# Patient Record
Sex: Female | Born: 1970 | Race: White | Hispanic: No | Marital: Married | Smoking: Never smoker
Health system: Southern US, Community
[De-identification: ages and names within clinical notes are randomized; demographics above are authoritative.]

## PROBLEM LIST (undated history)

## (undated) DIAGNOSIS — K219 Gastro-esophageal reflux disease without esophagitis: Secondary | ICD-10-CM

## (undated) DIAGNOSIS — B029 Zoster without complications: Secondary | ICD-10-CM

## (undated) HISTORY — PX: COLONOSCOPY: SHX174

## (undated) HISTORY — DX: Zoster without complications: B02.9

## (undated) HISTORY — PX: WISDOM TOOTH EXTRACTION: SHX21

## (undated) HISTORY — DX: Gastro-esophageal reflux disease without esophagitis: K21.9

---

## 2001-08-30 ENCOUNTER — Other Ambulatory Visit: Admission: RE | Admit: 2001-08-30 | Discharge: 2001-08-30 | Payer: Self-pay | Admitting: Gynecology

## 2004-08-11 ENCOUNTER — Inpatient Hospital Stay (HOSPITAL_COMMUNITY): Admission: AD | Admit: 2004-08-11 | Discharge: 2004-08-11 | Payer: Self-pay | Admitting: Obstetrics & Gynecology

## 2004-11-04 ENCOUNTER — Ambulatory Visit (HOSPITAL_COMMUNITY): Admission: RE | Admit: 2004-11-04 | Discharge: 2004-11-04 | Payer: Self-pay | Admitting: Obstetrics and Gynecology

## 2004-11-07 ENCOUNTER — Other Ambulatory Visit: Admission: RE | Admit: 2004-11-07 | Discharge: 2004-11-07 | Payer: Self-pay | Admitting: Obstetrics and Gynecology

## 2005-01-26 HISTORY — PX: CHOLECYSTECTOMY: SHX55

## 2005-05-29 ENCOUNTER — Inpatient Hospital Stay (HOSPITAL_COMMUNITY): Admission: AD | Admit: 2005-05-29 | Discharge: 2005-05-31 | Payer: Self-pay | Admitting: Obstetrics and Gynecology

## 2005-07-14 ENCOUNTER — Ambulatory Visit (HOSPITAL_COMMUNITY): Admission: RE | Admit: 2005-07-14 | Discharge: 2005-07-14 | Payer: Self-pay | Admitting: General Surgery

## 2005-07-14 ENCOUNTER — Encounter (INDEPENDENT_AMBULATORY_CARE_PROVIDER_SITE_OTHER): Payer: Self-pay | Admitting: Specialist

## 2007-02-21 ENCOUNTER — Inpatient Hospital Stay (HOSPITAL_COMMUNITY): Admission: AD | Admit: 2007-02-21 | Discharge: 2007-02-23 | Payer: Self-pay | Admitting: Obstetrics and Gynecology

## 2007-03-27 IMAGING — RF DG CHOLANGIOGRAM OPERATIVE
1 series · 5 of 5 positions shown · non-contrast
Comparison: none

CLINICAL DATA: Laparoscopic cholecystectomy.
 INTRAOPERATIVE CHOLANGIOGRAM
 45 C-arm fluoroscopic-guided spot images.  No biliary ductal filling defects, stricture or dilatation.  Contrast passes into the duodenum.

[Series 1: run · 2 acquisitions, 5 frames shown]
[im 1/2]
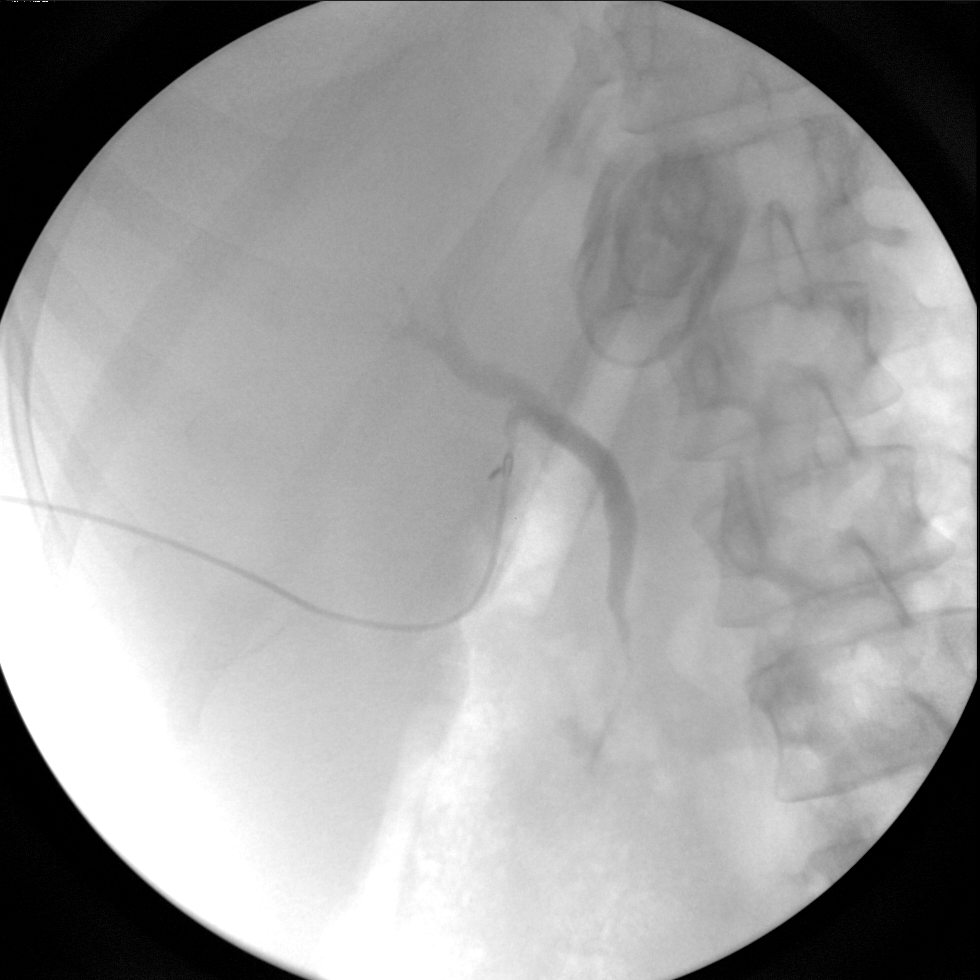
[im 1/2]
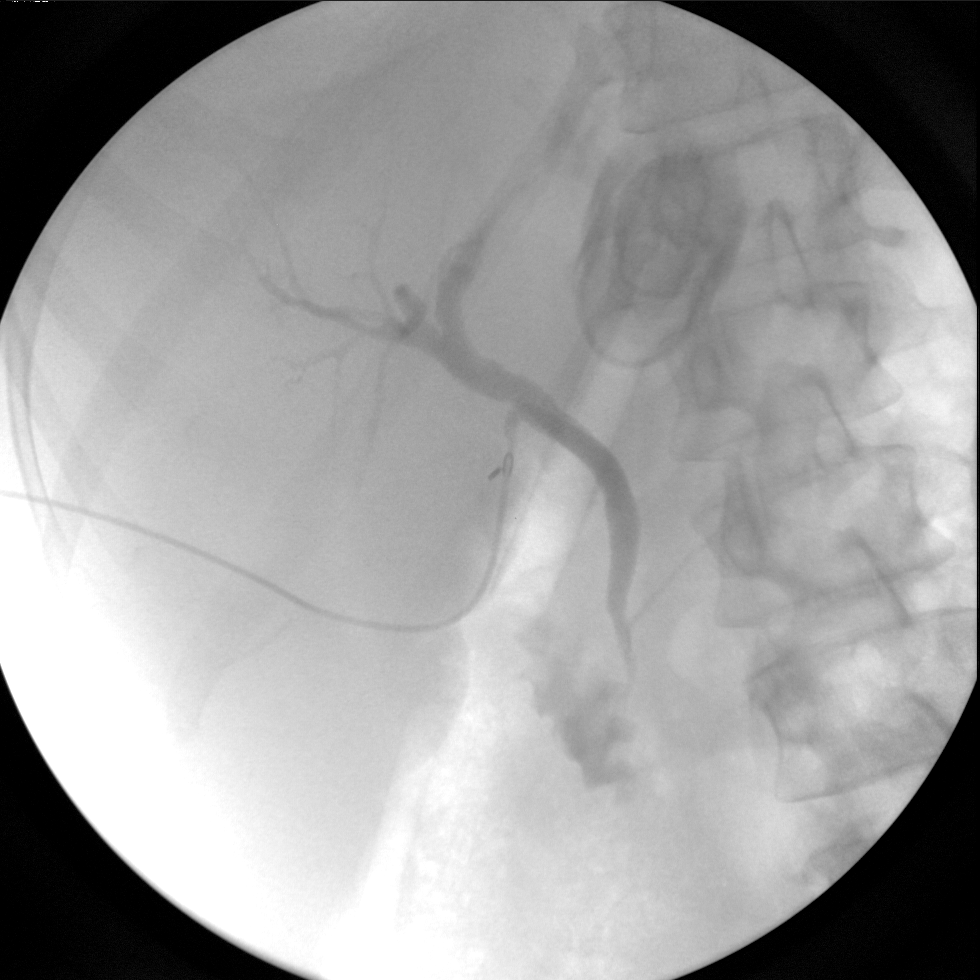
[im 1/2]
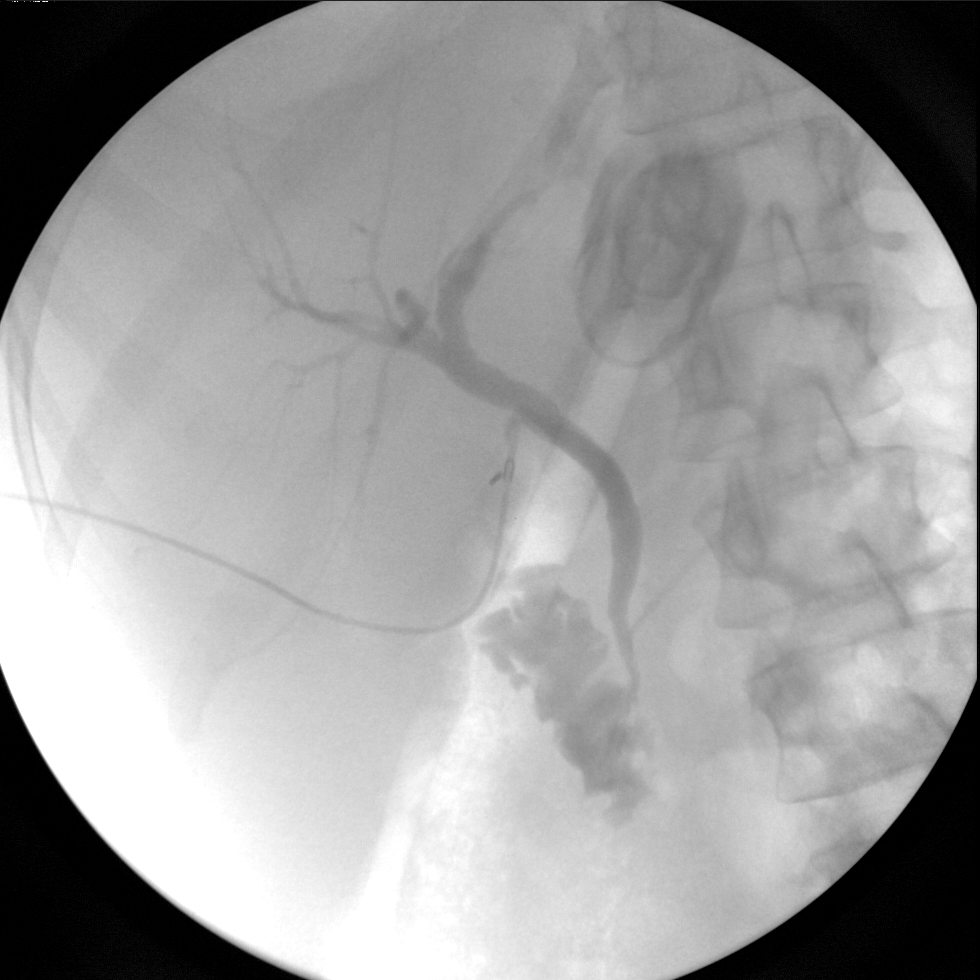
[im 1/2]
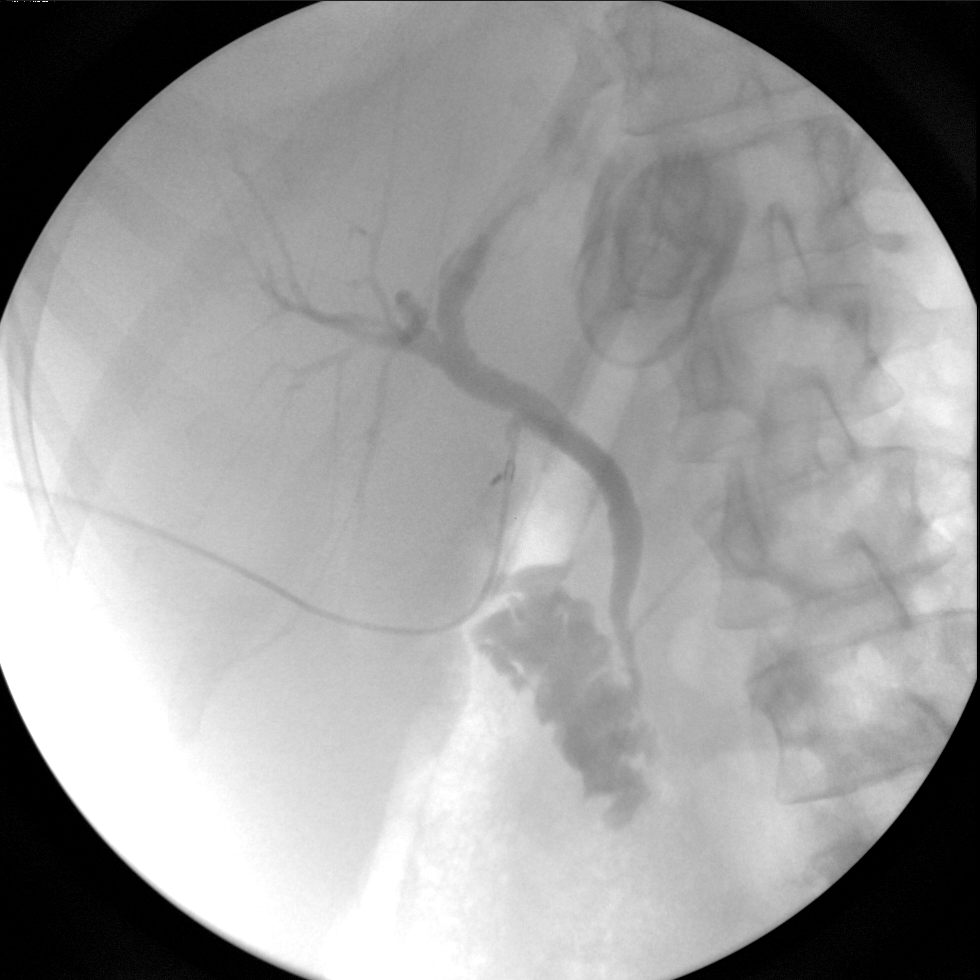
[im 2/2]
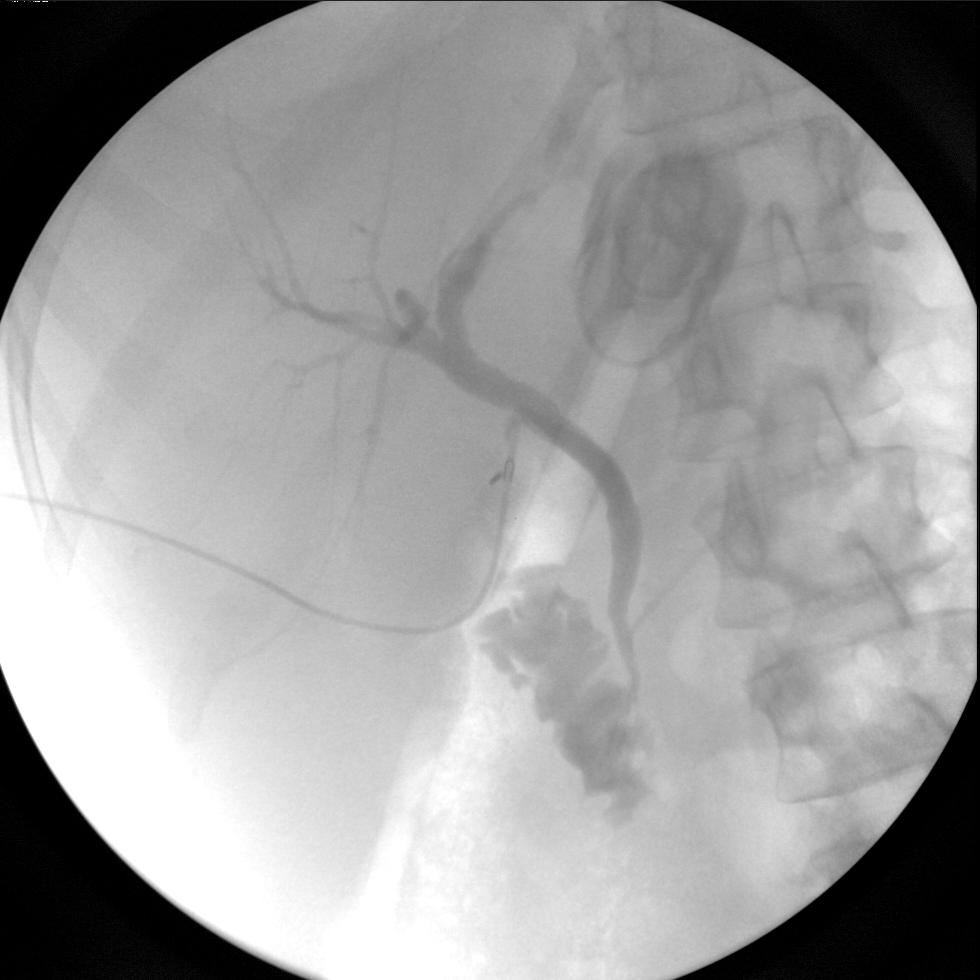

[5 of 5 positions shown; findings below may reference images not displayed]

IMPRESSION: Negative operative cholangiogram.

## 2010-06-10 NOTE — H&P (Signed)
NAMEMELAYNA, ROBARTS               ACCOUNT NO.:  [id number]   MEDICAL RECORD NO.:  [id number]          PATIENT TYPE:  MAT   LOCATION:  MATC                          FACILITY:  [facility]   PHYSICIAN:  Crist Fat. Rivard, M.D. DATE OF BIRTH:  17-Dec-1970   DATE OF ADMISSION:  02/21/2007  DATE OF DISCHARGE:                              HISTORY & PHYSICAL   HISTORY OF PRESENT ILLNESS:  Ms. Rudy is a 40 year old gravida II,  para 1-0-0-1 at 39-3/7 weeks who presents today with uterine  contractions every 5 minutes for the last several hours.  Her cervix has  been 3 cm in the office.  She denies any leaking or bleeding and reports  positive fetal movement.  Pregnancy has been remarkable for:  1. __________ pregnancies.  2. Group B strep negative.  3. Mild thrombocytopenia during pregnancy with platelet count on last      exam at approximately 108.  New OB was 138.  4. Platelet count was 138 at her OB visit.  It was 108 on her most      recent examination.   PRENATAL LABORATORIES:  Blood type is O positive, Rh antibody negative,  VDRL nonreactive, rubella titer positive, hepatitis B surface antigen is  negative, HIV is nonreactive.  Cystic fibrosis testing is negative.  Hemoglobin upon entering the practice was 12.8.  First trimester screen  was declined.  Quadruple screen was normal.  Glucola was normal.  Group  B strep culture was negative.   HISTORY OF PRESENT PREGNANCY:  The patient entered care approximately 12  weeks.  EDC of February 26, 2007, was established by last menstrual  period.  She had an ultrasound at 19 weeks for normal growth.  She had a  quadruple screen that was normal.  She had some reflux at 27 weeks and  was placed on Zofran and Protonix.  She was placed on Flexeril for some  low back pain spasms that helped.  She had some right upper quadrant  skin burning like when she has gallbladder pain, but no nausea, vomiting  or any other issues.  Her Group B strep was negative.   This was  determined to be likely musculoskeletal. CMP and CBC were done that were  normal.  She was placed on Vicodin if necessary.  The rest of her  pregnancy was essentially uncomplicated.   OBSTETRICAL HISTORY:  In 2007, she had a vaginal birth of a female  infant.  Weight is not documented on her prenatal record.  She was  overdue.  She had meconium stained fluid.  She did have retained  placenta and a third degree laceration.  She did not use any pain  medication until after delivery.   PAST MEDICAL HISTORY:  She had a history of UTIs in the past.   PAST SURGICAL HISTORY:  Only other surgery was wisdom teeth removed in  the past and gallbladder surgery in July 2008.   ALLERGIES:  SULFA.   FAMILY HISTORY:  Her father had an MI and angioplasty.  Her maternal  aunt and father had hypertension.  Her mother has  varicose veins.  Her  father had colon cancer in July 2008.  Maternal grandmother had  emphysema.  Maternal grandmother had diabetes.  Maternal grandfather had  lung cancer.   GENETIC HISTORY:  Unremarkable.   SOCIAL HISTORY:  The patient is married to father of the baby.  He is  involved and supported.  His name is [employer].  The patient is  Caucasian.  She is of the Muslim faith.  She is graduated, educated.  She is a Secretary/administrator.  Her husband also is college educated.  He  is self-employed.  She has been followed by the Certified Nurse Midwife  Service at [facility].  She denies any alcohol, drug or tobacco  use during this pregnancy.   PHYSICAL EXAMINATION:  VITAL SIGNS:  Stable.  The patient is afebrile.  HEENT:  Within normal limits.  LUNGS:  Breath sounds are clear.  HEART:  Regular rate and rhythm without murmur.  BREASTS:  Soft and nontender.  ABDOMEN:  Fundal height is approximately 39 cm.  Estimated fetal weight  is 7-8 pounds.  Uterine contractions are every 3 to 3-1/2 minutes,  moderate quality.  Cervix initially was 3 cm, it is now  4 cm, 100%  vertex and a -1 station with bulging bag of water.  Fetal heart rate is  reactive with decelerations.  There is a small amount of bloody show  noted.  EXTREMITIES:  Deep tendon reflexes are 2+ without clonus.  There is a  trace edema noted.   IMPRESSION:  1. Intrauterine pregnancy at 39-3/7 weeks.  2. Early labor.  3. Negative group B strep.  4. Desires noninterference of labor as much as possible.   PLAN:  1. Admit to birthing suite for consult with Dr. Silverio Lay as      attending physician.  2. Routine certified nurse midwife orders.  3. Will defer IV at this time.  If platelet count is low will initiate      saline lock.  4. The patient does agree to artificial rupture of membranes, and we      will do this once she gets to Labor and Delivery.      Renaldo Reel Emilee Hero, C.N.M.      Crist Fat Rivard, M.D.  Electronically Signed    VLL/MEDQ  D:  02/21/2007  T:  02/21/2007  Job:  [id number]

## 2010-06-13 NOTE — Op Note (Signed)
NAMESEERAT, [name]               ACCOUNT NO.:  [id number]   MEDICAL RECORD NO.:  [id number]          PATIENT TYPE:  AMB   LOCATION:  SDS                          FACILITY:  [facility]   PHYSICIAN:  Leonie Man, M.D.   DATE OF BIRTH:  1970/03/30   DATE OF PROCEDURE:  07/14/2005  DATE OF DISCHARGE:  07/14/2005                                 OPERATIVE REPORT   PREOPERATIVE DIAGNOSIS:  Chronic calculous cholecystitis.   POSTOPERATIVE DIAGNOSIS:  Chronic calculous cholecystitis.   PROCEDURE:  Laparoscopic cholecystectomy with interoperative cholangiogram.   SURGEON:  Leonie Man, M.D.   ASSISTANT:  Gabrielle Dare. Janee Morn, M.D.   ANESTHESIA:  General.   SPECIMENS:  Gallbladder with stones.   ESTIMATED BLOOD LOSS:  Minimal.   COMPLICATIONS:  None.   DISPOSITION:  The patient returned to the PACU in excellent condition.   [name] is a 40 year old female who is recently postpartum. She  presents with the onset of right upper quadrant abdominal pain which has  been worsening since her delivery.  This is associated with some mild  episodes of nausea but without any vomiting.  She underwent abdominal  ultrasound which demonstrates cholelithiasis without evidence of ductal  dilatation or pericholecystic fluid.  Liver function studies done prior to  surgery showed mild SGOT and SGPT elevations, no hyperbilirubinemia.  The  patient comes to the operating room after the risks and potential benefits  of surgery have been fully discussed.  All questions were answered and  consent was obtained.   PROCEDURE:  The patient is positioned supinely. Following the induction of  general endotracheal anesthesia, the abdomen is prepped and draped to be  included in the sterile operative field.  Open laparoscopy was created at  the umbilicus with insertion of a Hassan cannula and insufflation of the  peritoneal cavity to 14 mmHg pressure.  The camera was inserted and visual  exploration carried  out. The gallbladder was noted to be chronically  scarred.  The liver edges were sharp.  The liver surfaces smooth.  The  anterior gastric wall and duodenum with some adhesions up to the  gallbladder, but otherwise, appeared normal.  Neither the small or large  intestine appeared to be abnormal, the pelvic organs were not visualized.   Under direct vision, epigastric and lateral ports were placed.  The  gallbladder is grasped and retracted cephalad and dissection carried down  toward the ampulla with lysis of the adhesions from the duodenum to the  gallbladder.  Dissection was carried in the region of the hepatoduodenal  ligament with isolation of the cystic artery and cystic duct.  The cystic  duct was traced up to its entry into the gallbladder wall and down to the  common bile duct.  The cystic duct was clipped proximally and a cystic duct  cholangiogram was carried out by passing a Cook catheter into the abdomen  and placing it into the cystic duct and injecting 1/2 strength Hypaque dye  under fluoroscopic guidance into the extrahepatic biliary system.  The  resulting cholangiogram showed prompt flow of contrast into the duodenum and  up into the upper right and left hepatic radicals without any evidence of  filling defects.  The cholangiocatheter was removed and the cystic duct was  then doubly clipped and transected. The cystic artery was then isolated and  doubly clipped and transected.  The gallbladder was then dissected free from  the liver bed using electrocautery and maintaining hemostasis throughout the  course of dissection.  At the end of the dissection, the liver bed was then  checked for hemostasis and noted to be dry.  Sponge, instruments, and sharp  counts were fully verified.  The gallbladder was retrieved from the abdomen  through the umbilical port.  The wounds then closed in layers as follows.  The umbilical wound in two layers with 0 Vicryl and 4-0 Monocryl,  epigastric  and flank wounds were closed with 4-0 Monocryl sutures.  All wounds were  reinforced with Steri-Strips and sterile dressings applied.  The anesthetic  was reversed and the patient removed from the operating room to the recovery  room in stable condition.  She tolerated the procedure well.      Leonie Man, M.D.  Electronically Signed     PB/MEDQ  D:  07/14/2005  T:  07/14/2005  Job:  [id number]   cc:   Dois Davenport A. Rivard, M.D.  Fax: [phone]

## 2010-06-13 NOTE — H&P (Signed)
[name], [name]               ACCOUNT NO.:  [id number]   MEDICAL RECORD NO.:  [id number]          PATIENT TYPE:  INP   LOCATION:  9162                          [facility]ACILITY:  [facility]   PHYSICIAN:  Hal Morales, M.D.DATE OF BIRTH:  [birth year]   DATE OF ADMISSION:  05/29/2005  DATE OF DISCHARGE:                                HISTORY & PHYSICAL   HISTORY OF PRESENT ILLNESS:  This is a 40 year old gravida 1, para 0 at 58-  5/7 weeks, who presents with complaints of gush of fluid this morning at 4  a.m.  She was originally scheduled for induction of labor this morning for  postdates.  Pregnancy has been followed by the midwife service and  remarkable for:  #1 - Unsure LMP, #2 - group B strep negative.   ALLERGIES:  SULFA.   OBSTETRICAL HISTORY:  The patient is a primigravida.   PAST MEDICAL HISTORY:  Medical history is remarkable for childhood varicella  and history of UTIs.   PAST SURGICAL HISTORY:  Surgical history is remarkable for wisdom teeth  removed in her early 26s.   FAMILY HISTORY:  Family history is remarkable for father with MI, father and  mother with hypertension, mother with varicose veins, grandmother with  emphysema, grandmother with diabetes, grandfather with lung cancer.   GENETIC HISTORY:  Negative.   SOCIAL HISTORY:  The patient is married to [facility], who is involved  and supportive.  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  She denies any alcohol, tobacco or drug use.   PRENATAL LABORATORY DATA:  Hemoglobin 13.3, platelets 167,000.  Blood type O  positive, antibody screen negative.  RPR nonreactive.  Rubella immune.  Hepatitis negative.  Pap test normal.  Gonorrhea negative.  Chlamydia  negative.  Cystic fibrosis negative.   HISTORY OF CURRENT PREGNANCY:  The patient entered care at 74 weeks'  gestation.  She had an ultrasound to confirm dates and at that time, quad  screen was done and was normal.  Ultrasound at 19 weeks was  normal and she  had a group B strep that was negative at term.   OBJECTIVE DATA:  VITAL SIGNS:  Stable and afebrile.  HEENT:  Within normal limits.  NECK:  Thyroid normal and not enlarged.  CHEST:  Clear to auscultation.  HEART:  Regular rate and rhythm.  ABDOMEN:  Gravid, 40 cm, vertex to Leopold's.  Fetal monitor shows reactive  fetal heart rate with uterine contractions every 1-1/2 to 2 minutes.  PELVIC:  Cervix is 4, 90, -2, vertex presentation.  EXTREMITIES:  Within normal limits.   ASSESSMENT:  1.  Intrauterine pregnancy at 41-5/7 weeks.  2.  Spontaneous rupture of membranes.  3.  Early active labor.   PLAN:  1.  Admit to birthing suites.  2.  Dr. Pennie Rushing notified.  3.  Patient desires non-interventive birth and further orders to follow.      Marie L. Williams, C.N.M.      Hal Morales, M.D.  Electronically Signed    MLW/MEDQ  D:  05/29/2005  T:  05/29/2005  Job:  [id number]

## 2010-10-16 LAB — CBC
HCT: 33.3 — ABNORMAL LOW
HCT: 35.5 — ABNORMAL LOW
Hemoglobin: 11.4 — ABNORMAL LOW
Hemoglobin: 12.3
MCHC: 34.3
MCHC: 34.5
MCV: 85.6
MCV: 86.5
Platelets: 103 — ABNORMAL LOW
Platelets: 114 — ABNORMAL LOW
RBC: 3.85 — ABNORMAL LOW
RBC: 4.15
RDW: 15.6 — ABNORMAL HIGH
RDW: 16 — ABNORMAL HIGH
WBC: 10.9 — ABNORMAL HIGH
WBC: 9.9

## 2010-10-16 LAB — PLATELET COUNT: Platelets: 100 — ABNORMAL LOW

## 2010-10-16 LAB — RPR: RPR Ser Ql: NONREACTIVE

## 2013-02-08 ENCOUNTER — Emergency Department (HOSPITAL_COMMUNITY)
Admission: EM | Admit: 2013-02-08 | Discharge: 2013-02-08 | Disposition: A | Discharge status: Home / Self Care | Payer: BC Managed Care – PPO | Source: Home / Self Care | Attending: Family Medicine | Admitting: Family Medicine

## 2013-02-08 ENCOUNTER — Encounter (HOSPITAL_COMMUNITY): Payer: Self-pay | Admitting: Emergency Medicine

## 2013-02-08 DIAGNOSIS — M545 Low back pain, unspecified: Secondary | ICD-10-CM

## 2013-02-08 MED ORDER — DICLOFENAC SODIUM 50 MG PO TBEC: 50.0000 mg | DELAYED_RELEASE_TABLET | Freq: Two times a day (BID) | ORAL | Status: DC | PRN

## 2013-02-08 MED ORDER — CYCLOBENZAPRINE HCL 5 MG PO TABS: 5.0000 mg | ORAL_TABLET | Freq: Every evening | ORAL | Status: DC | PRN

## 2013-02-08 MED ORDER — TRAMADOL HCL 50 MG PO TABS: 50.0000 mg | ORAL_TABLET | Freq: Four times a day (QID) | ORAL | Status: DC | PRN

## 2013-02-08 NOTE — ED Notes (Signed)
Pain i nback after putting on shoes to get ready for work; no other known cause of pain; pain worse w certain movements

## 2013-02-08 NOTE — ED Provider Notes (Signed)
[name] is a 43 y.o. female who presents to Urgent Care today for low back pain. Patient notes acute onset of right sided low back pain radiating to the left low back occurring this morning. Pain is moderate and worse with activity. She denies any pain radiating to her legs weakness numbness bowel bladder dysfunction or difficulty walking. She's tried taking ibuprofen which did not help much. She feels well otherwise. No injury.   History reviewed. No pertinent past medical history. History  Substance Use Topics  . Smoking status: Never Smoker   . Smokeless tobacco: Not on file  . Alcohol Use: Not on file   ROS as above Medications: No current facility-administered medications for this encounter.   Current Outpatient Prescriptions  Medication Sig Dispense Refill  . cyclobenzaprine (FLEXERIL) 5 MG tablet Take 1 tablet (5 mg total) by mouth at bedtime as needed for muscle spasms.  20 tablet  0  . diclofenac (VOLTAREN) 50 MG EC tablet Take 1 tablet (50 mg total) by mouth 2 (two) times daily as needed.  60 tablet  0  . traMADol (ULTRAM) 50 MG tablet Take 1 tablet (50 mg total) by mouth every 6 (six) hours as needed.  10 tablet  0    Exam:  BP 134/86  Pulse 85  Temp(Src) 98.1 F (36.7 C) (Oral)  Resp 16  SpO2 100%  LMP 01/31/2013 Gen: Well NAD BACK: NONTENDER TO SPINAL MIDLINE. TENDER PALPATION BILATERAL LUMBAR PARASPINAL MUSCLES.  Range of motion is intact. Straight leg raise and Corky Sox tests are negative bilaterally. Sensation is intact bilateral lower extremities. Strength is intact bilateral lower extremities. Patient can stand on her toes heels get on and off exam table squat and has a normal gait. Reflexes are equal and normal bilateral knees and ankles.   Assessment and Plan: 43 y.o. female with lumbago due to myofascial injury. Plan treatment with diclofenac, Flexeril, and tramadol. Additionally will use heating pad at home exercise program. Followup with Dr. Tamala Julian or  [facility] sports medicine if not improving.  Discussed warning signs or symptoms. Please see discharge instructions. Patient expresses understanding.    Gregor Hams, MD 02/08/13 [phone]

## 2013-02-08 NOTE — Discharge Instructions (Signed)
Thank you for coming in today. Take diclofenac twice daily for pain as needed starting this evening. Use Flexeril at bedtime as needed. Use tramadol for severe pain. Do not drive after taking either Flexeril or tramadol. Followup with Dr. Anola Gurney sports medicine if not getting better in 1-2 weeks. Come back or go to the emergency room if you notice new weakness new numbness problems walking or bowel or bladder problems.  Back Exercises These exercises may help you when beginning to rehabilitate your injury. Your symptoms may resolve with or without further involvement from your physician, physical therapist or athletic trainer. While completing these exercises, remember:   Restoring tissue flexibility helps normal motion to return to the joints. This allows healthier, less painful movement and activity.  An effective stretch should be held for at least 30 seconds.  A stretch should never be painful. You should only feel a gentle lengthening or release in the stretched tissue. STRETCH  Extension, Prone on Elbows   Lie on your stomach on the floor, a bed will be too soft. Place your palms about shoulder width apart and at the height of your head.  Place your elbows under your shoulders. If this is too painful, stack pillows under your chest.  Allow your body to relax so that your hips drop lower and make contact more completely with the floor.  Hold this position for __________ seconds.  Slowly return to lying flat on the floor. Repeat __________ times. Complete this exercise __________ times per day.  RANGE OF MOTION  Extension, Prone Press Ups   Lie on your stomach on the floor, a bed will be too soft. Place your palms about shoulder width apart and at the height of your head.  Keeping your back as relaxed as possible, slowly straighten your elbows while keeping your hips on the floor. You may adjust the placement of your hands to maximize your comfort. As you gain motion, your  hands will come more underneath your shoulders.  Hold this position __________ seconds.  Slowly return to lying flat on the floor. Repeat __________ times. Complete this exercise __________ times per day.  RANGE OF MOTION- Quadruped, Neutral Spine   Assume a hands and knees position on a firm surface. Keep your hands under your shoulders and your knees under your hips. You may place padding under your knees for comfort.  Drop your head and point your tail bone toward the ground below you. This will round out your low back like an angry cat. Hold this position for __________ seconds.  Slowly lift your head and release your tail bone so that your back sags into a large arch, like an old horse.  Hold this position for __________ seconds.  Repeat this until you feel limber in your low back.  Now, find your "sweet spot." This will be the most comfortable position somewhere between the two previous positions. This is your neutral spine. Once you have found this position, tense your stomach muscles to support your low back.  Hold this position for __________ seconds. Repeat __________ times. Complete this exercise __________ times per day.  STRETCH  Flexion, Single Knee to Chest   Lie on a firm bed or floor with both legs extended in front of you.  Keeping one leg in contact with the floor, bring your opposite knee to your chest. Hold your leg in place by either grabbing behind your thigh or at your knee.  Pull until you feel a gentle stretch in  your low back. Hold __________ seconds.  Slowly release your grasp and repeat the exercise with the opposite side. Repeat __________ times. Complete this exercise __________ times per day.  STRETCH - Hamstrings, Standing  Stand or sit and extend your right / left leg, placing your foot on a chair or foot stool  Keeping a slight arch in your low back and your hips straight forward.  Lead with your chest and lean forward at the waist until you  feel a gentle stretch in the back of your right / left knee or thigh. (When done correctly, this exercise requires leaning only a small distance.)  Hold this position for __________ seconds. Repeat __________ times. Complete this stretch __________ times per day. STRENGTHENING  Deep Abdominals, Pelvic Tilt   Lie on a firm bed or floor. Keeping your legs in front of you, bend your knees so they are both pointed toward the ceiling and your feet are flat on the floor.  Tense your lower abdominal muscles to press your low back into the floor. This motion will rotate your pelvis so that your tail bone is scooping upwards rather than pointing at your feet or into the floor.  With a gentle tension and even breathing, hold this position for __________ seconds. Repeat __________ times. Complete this exercise __________ times per day.  STRENGTHENING  Abdominals, Crunches   Lie on a firm bed or floor. Keeping your legs in front of you, bend your knees so they are both pointed toward the ceiling and your feet are flat on the floor. Cross your arms over your chest.  Slightly tip your chin down without bending your neck.  Tense your abdominals and slowly lift your trunk high enough to just clear your shoulder blades. Lifting higher can put excessive stress on the low back and does not further strengthen your abdominal muscles.  Control your return to the starting position. Repeat __________ times. Complete this exercise __________ times per day.  STRENGTHENING  Quadruped, Opposite UE/LE Lift   Assume a hands and knees position on a firm surface. Keep your hands under your shoulders and your knees under your hips. You may place padding under your knees for comfort.  Find your neutral spine and gently tense your abdominal muscles so that you can maintain this position. Your shoulders and hips should form a rectangle that is parallel with the floor and is not twisted.  Keeping your trunk steady, lift your  right hand no higher than your shoulder and then your left leg no higher than your hip. Make sure you are not holding your breath. Hold this position __________ seconds.  Continuing to keep your abdominal muscles tense and your back steady, slowly return to your starting position. Repeat with the opposite arm and leg. Repeat __________ times. Complete this exercise __________ times per day. Document Released: 01/30/2005 Document Revised: 04/06/2011 Document Reviewed: 04/26/2008 [facility] Patient Information 2014 [place], [place].

## 2014-09-27 ENCOUNTER — Ambulatory Visit (INDEPENDENT_AMBULATORY_CARE_PROVIDER_SITE_OTHER): Payer: 59 | Admitting: Primary Care

## 2014-09-27 ENCOUNTER — Encounter: Payer: Self-pay | Admitting: Primary Care

## 2014-09-27 VITALS — BP 132/90 | HR 93 | Temp 98.3°F | Ht 61.0 in | Wt 154.8 lb

## 2014-09-27 DIAGNOSIS — R030 Elevated blood-pressure reading, without diagnosis of hypertension: Secondary | ICD-10-CM | POA: Diagnosis not present

## 2014-09-27 HISTORY — DX: Elevated blood-pressure reading, without diagnosis of hypertension: R03.0

## 2014-09-27 NOTE — Assessment & Plan Note (Signed)
Elevated reading today in the clinic today at 132/90, same on recheck. Will monitor at next visit.

## 2014-09-27 NOTE — Progress Notes (Signed)
Pre visit review using our clinic review tool, if applicable. No additional management support is needed unless otherwise documented below in the visit note. 

## 2014-09-27 NOTE — Progress Notes (Signed)
Subjective:    Patient ID: [name], female    DOB: January 10, 1971, 44 y.o.   MRN: [id number]  HPI  [name] is a 43 year old female who presents today to establish care and discuss the problems mentioned below. Will obtain old records.  1) Elevated blood pressure reading: Elevated in the clinic today at 132/90. Denies ever having elevated blood pressure. Denies headaches, chest pain.  2) Dizziness: Intermittently once or twice monthly that will last for a few minutes. Sometimes will experience symptoms of the room spinning and feeling off balance. She does not currently take any medication for her dizziness.  3) Diarrhea: Present most days of the week with 2-3 episodes a day. Gall bladder was removed in 2007 after her first child. She will take imodium rarely which will help decrease symptoms.   Review of Systems  Constitutional: Negative for unexpected weight change.  HENT: Negative for rhinorrhea.   Respiratory: Negative for cough and shortness of breath.   Cardiovascular: Negative for chest pain.  Gastrointestinal: Positive for diarrhea. Negative for constipation.  Genitourinary: Negative for difficulty urinating.       Regular periods  Musculoskeletal: Negative for myalgias and arthralgias.  Skin: Negative for rash.  Neurological: Positive for dizziness. Negative for numbness and headaches.  Psychiatric/Behavioral:       Occasionally feels down and anxious, phq 9 score of 6 today.       No past medical history on file.  Social History   Social History  . Marital Status: Married    Spouse Name: N/A  . Number of Children: N/A  . Years of Education: N/A   Occupational History  . Not on file.   Social History Main Topics  . Smoking status: Never Smoker   . Smokeless tobacco: Not on file  . Alcohol Use: Not on file  . Drug Use: Not on file  . Sexual Activity: Not on file   Other Topics Concern  . Not on file   Social History Narrative   Married.   2  children.   Works as a Building control surveyor.   Masters Degree   Enjoys spending time outside, once painted.       Past Surgical History  Procedure Laterality Date  . Cholecystectomy  2007    Family History  Problem Relation Age of Onset  . Hypertension Father   . Heart disease Father     Myocardial infarction  . Hypercholesterolemia Father   . Anxiety disorder Sister     Allergies  Allergen Reactions  . Sulfa Antibiotics     No current outpatient prescriptions on file prior to visit.   No current facility-administered medications on file prior to visit.    BP 132/90 mmHg  Pulse 93  Temp(Src) 98.3 F (36.8 C) (Oral)  Ht 5\' 1"  (1.549 m)  Wt 154 lb 12.8 oz (70.217 kg)  BMI 29.26 kg/m2  SpO2 97%  LMP 09/06/2014    Objective:   Physical Exam  Constitutional: She appears well-nourished.  Cardiovascular: Normal rate and regular rhythm.   Pulmonary/Chest: Effort normal and breath sounds normal.  Abdominal: Soft. Bowel sounds are normal. There is no tenderness.  Skin: Skin is warm and dry.  Psychiatric: She has a normal mood and affect.  Became tearful duing exam when asked about depression and anxiety          Assessment & Plan:  She became tearful during exam when asked about concerns for anxiety and depression. She  is under a lot of stress at home with her children and doesn't get much emotional support from her husband. She declines therapy today but will think about it for next visit. No bruising or signs of abuse noted, but she was wearing long sleeves and pants. Will continue to monitor.

## 2014-09-27 NOTE — Patient Instructions (Signed)
Please schedule a physical with me in the next 3 months. You will also schedule a lab only appointment one week prior. We will discuss your lab results during your physical.  It was a pleasure to meet you today! Please don't hesitate to call me with any questions. Welcome to [facility]!   

## 2014-11-15 ENCOUNTER — Other Ambulatory Visit: Payer: Self-pay | Admitting: Primary Care

## 2014-11-15 DIAGNOSIS — Z0000 Encounter for general adult medical examination without abnormal findings: Secondary | ICD-10-CM

## 2014-11-23 ENCOUNTER — Other Ambulatory Visit (INDEPENDENT_AMBULATORY_CARE_PROVIDER_SITE_OTHER): Payer: 59

## 2014-11-23 DIAGNOSIS — Z0000 Encounter for general adult medical examination without abnormal findings: Secondary | ICD-10-CM | POA: Diagnosis not present

## 2014-11-23 LAB — VITAMIN D 25 HYDROXY (VIT D DEFICIENCY, FRACTURES): VITD: 9.35 ng/mL — ABNORMAL LOW (ref 30.00–100.00)

## 2014-11-23 LAB — COMPREHENSIVE METABOLIC PANEL
ALT: 18 U/L (ref 0–35)
AST: 18 U/L (ref 0–37)
Albumin: 4.1 g/dL (ref 3.5–5.2)
Alkaline Phosphatase: 82 U/L (ref 39–117)
BUN: 9 mg/dL (ref 6–23)
CO2: 27 mEq/L (ref 19–32)
Calcium: 9.4 mg/dL (ref 8.4–10.5)
Chloride: 104 mEq/L (ref 96–112)
Creatinine, Ser: 0.73 mg/dL (ref 0.40–1.20)
GFR: 92.65 mL/min (ref >60.00)
Glucose, Bld: 91 mg/dL (ref 70–99)
Potassium: 4.2 mEq/L (ref 3.5–5.1)
Sodium: 140 mEq/L (ref 135–145)
Total Bilirubin: 0.3 mg/dL (ref 0.2–1.2)
Total Protein: 7 g/dL (ref 6.0–8.3)

## 2014-11-23 LAB — LIPID PANEL
Cholesterol: 190 mg/dL (ref 0–200)
HDL: 39.3 mg/dL (ref >39.00)
LDL Cholesterol: 125 mg/dL — ABNORMAL HIGH (ref 0–99)
NonHDL: 151.09
Total CHOL/HDL Ratio: 5
Triglycerides: 129 mg/dL (ref 0.0–149.0)
VLDL: 25.8 mg/dL (ref 0.0–40.0)

## 2014-11-23 LAB — CBC
HCT: 42 % (ref 36.0–46.0)
Hemoglobin: 13.7 g/dL (ref 12.0–15.0)
MCHC: 32.6 g/dL (ref 30.0–36.0)
MCV: 82.8 fl (ref 78.0–100.0)
Platelets: 194 10*3/uL (ref 150.0–400.0)
RBC: 5.07 Mil/uL (ref 3.87–5.11)
RDW: 14.6 % (ref 11.5–15.5)
WBC: 8.7 10*3/uL (ref 4.0–10.5)

## 2014-11-23 LAB — TSH: TSH: 1.29 u[IU]/mL (ref 0.35–4.50)

## 2014-11-30 ENCOUNTER — Encounter: Payer: Self-pay | Admitting: Primary Care

## 2014-11-30 ENCOUNTER — Encounter: Payer: Self-pay | Admitting: Gastroenterology

## 2014-11-30 ENCOUNTER — Ambulatory Visit (INDEPENDENT_AMBULATORY_CARE_PROVIDER_SITE_OTHER): Payer: 59 | Admitting: Primary Care

## 2014-11-30 VITALS — BP 118/78 | HR 94 | Temp 98.0°F | Ht 61.0 in | Wt 157.0 lb

## 2014-11-30 DIAGNOSIS — R030 Elevated blood-pressure reading, without diagnosis of hypertension: Secondary | ICD-10-CM

## 2014-11-30 DIAGNOSIS — Z23 Encounter for immunization: Secondary | ICD-10-CM | POA: Diagnosis not present

## 2014-11-30 DIAGNOSIS — E559 Vitamin D deficiency, unspecified: Secondary | ICD-10-CM | POA: Diagnosis not present

## 2014-11-30 DIAGNOSIS — Z0000 Encounter for general adult medical examination without abnormal findings: Secondary | ICD-10-CM

## 2014-11-30 DIAGNOSIS — Z800 Family history of malignant neoplasm of digestive organs: Secondary | ICD-10-CM

## 2014-11-30 MED ORDER — VITAMIN D (ERGOCALCIFEROL) 1.25 MG (50000 UNIT) PO CAPS: ORAL_CAPSULE | ORAL | Status: DC

## 2014-11-30 NOTE — Assessment & Plan Note (Signed)
Vitamin D level of 9. RX for vitamin D 50,000 units weekly provided. Will recheck in 3 months.

## 2014-11-30 NOTE — Progress Notes (Signed)
Subjective:    Patient ID: [name], female    DOB: 09-19-1970, 44 y.o.   MRN: [id number]  HPI  [name] is a 44 year old female who presents today for complete physical.  Immunizations: -Tetanus: Unsure, believes it's been over 10 years.  -Influenza: Due today.   Diet: Endorses a poor diet. Breakfast: Skips Snack: Cookies Lunch: Skips Snack: Cereal  Dinner: Varies. Cooks (hamburger helper, oven fried chicken, starch, beans, bread). Fast food once or twice a weekend. Desserts: Occasionally.  Exercise: Does not exercise. Eye exam: Completed 1 year ago, no changes in vision. Dental exam: Completed 8 years ago. Pap Smear: Completed 7 years ago. Declines today. Mammogram: Has never completed. Handout provided.   Review of Systems  Constitutional: Positive for fatigue. Negative for fever, chills and unexpected weight change.  HENT: Positive for rhinorrhea.   Respiratory: Positive for cough. Negative for shortness of breath.   Cardiovascular: Negative for chest pain.  Gastrointestinal: Negative for diarrhea and constipation.  Genitourinary: Negative for difficulty urinating.       Regular periods  Musculoskeletal: Negative for myalgias and arthralgias.  Skin: Negative for rash.  Neurological: Negative for dizziness, numbness and headaches.  Psychiatric/Behavioral:       Denies concerns for anxiety or depression.       No past medical history on file.  Social History   Social History  . Marital Status: Married    Spouse Name: N/A  . Number of Children: N/A  . Years of Education: N/A   Occupational History  . Not on file.   Social History Main Topics  . Smoking status: Never Smoker   . Smokeless tobacco: Not on file  . Alcohol Use: Not on file  . Drug Use: Not on file  . Sexual Activity: Not on file   Other Topics Concern  . Not on file   Social History Narrative   Married.   2 children.   Works as a Building control surveyor.   Masters Degree   Enjoys spending time outside, once painted.       Past Surgical History  Procedure Laterality Date  . Cholecystectomy  2007    Family History  Problem Relation Age of Onset  . Hypertension Father   . Heart disease Father     Myocardial infarction  . Hypercholesterolemia Father   . Anxiety disorder Sister   . Colon cancer Father     Allergies  Allergen Reactions  . Sulfa Antibiotics     No current outpatient prescriptions on file prior to visit.   No current facility-administered medications on file prior to visit.    BP 118/78 mmHg  Pulse 94  Temp(Src) 98 F (36.7 C) (Oral)  Ht 5\' 1"  (1.549 m)  Wt 157 lb (71.215 kg)  BMI 29.68 kg/m2  SpO2 98%  LMP 10/30/2014    Objective:   Physical Exam  Constitutional: She is oriented to person, place, and time. She appears well-nourished.  HENT:  Right Ear: Tympanic membrane and ear canal normal.  Left Ear: Tympanic membrane and ear canal normal.  Nose: Nose normal.  Mouth/Throat: Oropharynx is clear and moist.  Eyes: Conjunctivae and EOM are normal. Pupils are equal, round, and reactive to light.  Neck: Neck supple. No thyromegaly present.  Cardiovascular: Normal rate and regular rhythm.   Pulmonary/Chest: Effort normal and breath sounds normal.  Abdominal: Soft. Bowel sounds are normal. There is no tenderness.  Musculoskeletal: Normal range of motion.  Neurological: She is  alert and oriented to person, place, and time. She has normal reflexes.  Skin: Skin is warm and dry.  Psychiatric: She has a normal mood and affect.  More interactive and upbeat today.          Assessment & Plan:

## 2014-11-30 NOTE — Assessment & Plan Note (Signed)
Stable in clinic today. Will continue to monitor.

## 2014-11-30 NOTE — Progress Notes (Signed)
Pre visit review using our clinic review tool, if applicable. No additional management support is needed unless otherwise documented below in the visit note. 

## 2014-11-30 NOTE — Assessment & Plan Note (Signed)
Tdap and influenza provided today. Declines Pap. Handout provided for mammogram. Exam unremarkable. Labs with vitamin D deficiency which require treatment. Discussed importance of healthy diet and exercise. Follow up in 1 year for repeat physical.

## 2014-11-30 NOTE — Patient Instructions (Signed)
It is important that you improve your diet. Please limit carbohydrates in the form of white bread, rice, pasta, cakes, cookies, fried foods, etc. Increase your consumption of fresh fruits and vegetables, and lean meats.  You need to consume about 2 liters of water daily.  Start exercising. You should be getting 1 hour of moderate intensity exercise 5 days weekly.  Start Vitamin D capsules. Take 1 capsule by mouth once weekly for a total of 12 weeks.  Schedule a lab only appointment in 12 weeks for vitamin D recheck.  Follow up in 1 year for repeat physical or sooner if needed.  It was a pleasure to see you today!

## 2014-12-28 ENCOUNTER — Encounter: Payer: 59 | Admitting: Primary Care

## 2015-02-01 ENCOUNTER — Ambulatory Visit: Payer: 59 | Admitting: Gastroenterology

## 2015-02-06 ENCOUNTER — Ambulatory Visit (INDEPENDENT_AMBULATORY_CARE_PROVIDER_SITE_OTHER): Payer: BLUE CROSS/BLUE SHIELD | Admitting: Gastroenterology

## 2015-02-06 ENCOUNTER — Encounter: Payer: Self-pay | Admitting: Gastroenterology

## 2015-02-06 VITALS — BP 102/70 | HR 76 | Ht 61.0 in | Wt 161.0 lb

## 2015-02-06 DIAGNOSIS — Z800 Family history of malignant neoplasm of digestive organs: Secondary | ICD-10-CM

## 2015-02-06 DIAGNOSIS — R197 Diarrhea, unspecified: Secondary | ICD-10-CM

## 2015-02-06 MED ORDER — CHOLESTYRAMINE LIGHT 4 G PO PACK: 4.0000 g | PACK | Freq: Two times a day (BID) | ORAL | Status: DC

## 2015-02-06 MED ORDER — NA SULFATE-K SULFATE-MG SULF 17.5-3.13-1.6 GM/177ML PO SOLN: 1.0000 | Freq: Once | ORAL | Status: DC

## 2015-02-06 NOTE — Progress Notes (Signed)
KIMBRE WESTERKAMP    [id number]    Jun 06, 1970  Primary Care Physician:Clark,Katherine Kendal, NP  Referring Physician: Pleas Koch, NP [address] [place], [place] [address]  Chief complaint:  Family history of colon cancer   HPI: 45 year old female here for new patient visit to discuss colonoscopy. Her father was diagnosed with colon cancer at age 39, he had resection and chemotherapy and is currently in remission. No other family history of colon cancer, gastric cancer or breast cancer. On review of systems she complains of intermittent diarrhea, worse with dairy and greasy food. He thinks she may be lactose intolerant and does take Lactaid but does not seem to help all the time. Whenever she eats outside she always has to have a bowel movement after. No blood in stool. She feels her issues with diarrhea started after her cholecystectomy in 2007. Her weight has been stable.   Outpatient Encounter Prescriptions as of 02/06/2015  Medication Sig  . Vitamin D, Ergocalciferol, (DRISDOL) 50000 UNITS CAPS capsule Take 1 capsule by mouth once weekly for a total of 12 weeks.  . cholestyramine light (PREVALITE) 4 g packet Take 1 packet (4 g total) by mouth 2 (two) times daily.   No facility-administered encounter medications on file as of 02/06/2015.    Allergies as of 02/06/2015 - Review Complete 02/06/2015  Allergen Reaction Noted  . Sulfa antibiotics  02/08/2013    History reviewed. No pertinent past medical history.  Past Surgical History  Procedure Laterality Date  . Cholecystectomy  2007  . Wisdom tooth extraction      Family History  Problem Relation Age of Onset  . Hypertension Father   . Heart disease Father     Myocardial infarction  . Hypercholesterolemia Father   . Anxiety disorder Sister   . Colon cancer Father     in his 8s  . Diabetes Maternal Grandmother     Deceased    Social History   Social History  . Marital Status: Married   Spouse Name: N/A  . Number of Children: 2  . Years of Education: N/A   Occupational History  . rare book conservator    Social History Main Topics  . Smoking status: Never Smoker   . Smokeless tobacco: Never Used  . Alcohol Use: No  . Drug Use: No  . Sexual Activity: Not on file   Other Topics Concern  . Not on file   Social History Narrative   Married.   2 children.   Works as a Building control surveyor.   Masters Degree   Enjoys spending time outside, once painted.         Review of systems: Review of Systems  Constitutional: Negative for fever and chills.  HENT: Negative.   Eyes: Negative for blurred vision.  Respiratory: Negative for cough, shortness of breath and wheezing.   Cardiovascular: Negative for chest pain and palpitations.  Gastrointestinal: as per HPI Genitourinary: Negative for dysuria, urgency, frequency and hematuria.  Musculoskeletal: Negative for myalgias, back pain and joint pain.  Skin: Negative for itching and rash.  Neurological: Negative for dizziness, tremors, focal weakness, seizures and loss of consciousness.  Endo/Heme/Allergies: Negative for environmental allergies.  Psychiatric/Behavioral: Negative for depression, suicidal ideas and hallucinations.  All other systems reviewed and are negative.   Physical Exam: Filed Vitals:   02/06/15 0940  BP: 102/70  Pulse: 76   Gen:      No  acute distress HEENT:  EOMI, sclera anicteric Neck:     No masses; no thyromegaly Lungs:    Clear to auscultation bilaterally; normal respiratory effort CV:         Regular rate and rhythm; no murmurs Abd:      + bowel sounds; soft, non-tender; no palpable masses, no distension Ext:    No edema; adequate peripheral perfusion Skin:      Warm and dry; no rash Neuro: alert and oriented x 3 Psych: normal mood and affect  Data Reviewed:  Reviewed her chart in epic and recent labs   Assessment and Plan/Recommendations: 45 year old female here for new  patient visit to discuss colonoscopy Father was diagnosed with colon cancer at age 33 We will schedule for colonoscopy for screening for colorectal cancer Intermittent diarrhea: Post cholecystectomy plus possible lactose intolerance Advised patient to avoid dairy products We will do a trial of cholestyramine twice daily We'll also obtain biopsies from the right and left colon to rule out microscopic colitis the time of screening colonoscopy Return in 3-6 months  K. Denzil Magnuson , MD [phone] Mon-Fri 8a-5p [phone] after 5p, weekends, holidays

## 2015-02-06 NOTE — Patient Instructions (Addendum)
You have been scheduled for a colonoscopy. Please follow written instructions given to you at your visit today.  Please pick up your prep supplies at the pharmacy within the next 1-3 days. If you use inhalers (even only as needed), please bring them with you on the day of your procedure. Your physician has requested that you go to www.startemmi.com and enter the access code given to you at your visit today. This web site gives a general overview about your procedure. However, you should still follow specific instructions given to you by our office regarding your preparation for the procedure.  We have sent your prescription to your pharmacy Follow up in 6 months

## 2015-02-08 ENCOUNTER — Telehealth: Payer: Self-pay | Admitting: Gastroenterology

## 2015-02-08 NOTE — Telephone Encounter (Signed)
suprep sample left at the front desk for pt to pick up.  Pt is aware

## 2015-02-14 ENCOUNTER — Telehealth: Payer: Self-pay | Admitting: Gastroenterology

## 2015-02-14 NOTE — Telephone Encounter (Signed)
Called patient to let her know we do not do prior authorizations on prep kits. But she can have a suprep sample kit as soon as we get them in.

## 2015-02-22 ENCOUNTER — Other Ambulatory Visit (INDEPENDENT_AMBULATORY_CARE_PROVIDER_SITE_OTHER): Payer: BLUE CROSS/BLUE SHIELD

## 2015-02-22 DIAGNOSIS — E559 Vitamin D deficiency, unspecified: Secondary | ICD-10-CM

## 2015-02-22 LAB — VITAMIN D 25 HYDROXY (VIT D DEFICIENCY, FRACTURES): VITD: 38.49 ng/mL (ref 30.00–100.00)

## 2015-03-22 ENCOUNTER — Ambulatory Visit (AMBULATORY_SURGERY_CENTER): Payer: BLUE CROSS/BLUE SHIELD | Admitting: Gastroenterology

## 2015-03-22 ENCOUNTER — Encounter: Payer: Self-pay | Admitting: Gastroenterology

## 2015-03-22 VITALS — BP 115/75 | HR 80 | Temp 99.2°F | Resp 14 | Ht 61.0 in | Wt 161.0 lb

## 2015-03-22 DIAGNOSIS — D123 Benign neoplasm of transverse colon: Secondary | ICD-10-CM

## 2015-03-22 DIAGNOSIS — Z800 Family history of malignant neoplasm of digestive organs: Secondary | ICD-10-CM

## 2015-03-22 DIAGNOSIS — Z1211 Encounter for screening for malignant neoplasm of colon: Secondary | ICD-10-CM

## 2015-03-22 DIAGNOSIS — D125 Benign neoplasm of sigmoid colon: Secondary | ICD-10-CM

## 2015-03-22 MED ORDER — SODIUM CHLORIDE 0.9 % IV SOLN: 500.0000 mL | INTRAVENOUS | Status: DC

## 2015-03-22 NOTE — Patient Instructions (Signed)
YOU HAD AN ENDOSCOPIC PROCEDURE TODAY AT [facility]:   Refer to the procedure report that was given to you for any specific questions about what was found during the examination.  If the procedure report does not answer your questions, please call your gastroenterologist to clarify.  If you requested that your care partner not be given the details of your procedure findings, then the procedure report has been included in a sealed envelope for you to review at your convenience later.  YOU SHOULD EXPECT: Some feelings of bloating in the abdomen. Passage of more gas than usual.  Walking can help get rid of the air that was put into your GI tract during the procedure and reduce the bloating. If you had a lower endoscopy (such as a colonoscopy or flexible sigmoidoscopy) you may notice spotting of blood in your stool or on the toilet paper. If you underwent a bowel prep for your procedure, you may not have a normal bowel movement for a few days.  Please Note:  You might notice some irritation and congestion in your nose or some drainage.  This is from the oxygen used during your procedure.  There is no need for concern and it should clear up in a day or so.  SYMPTOMS TO REPORT IMMEDIATELY:   Following lower endoscopy (colonoscopy or flexible sigmoidoscopy):  Excessive amounts of blood in the stool  Significant tenderness or worsening of abdominal pains  Swelling of the abdomen that is new, acute  Fever of 100F or higher   For urgent or emergent issues, a gastroenterologist can be reached at any hour by calling [phone].   DIET: Your first meal following the procedure should be a small meal and then it is ok to progress to your normal diet. Heavy or fried foods are harder to digest and may make you feel nauseous or bloated.  Likewise, meals heavy in dairy and vegetables can increase bloating.  Drink plenty of fluids but you should avoid alcoholic beverages for 24  hours.  ACTIVITY:  You should plan to take it easy for the rest of today and you should NOT DRIVE or use heavy machinery until tomorrow (because of the sedation medicines used during the test).    FOLLOW UP: Our staff will call the number listed on your records the next business day following your procedure to check on you and address any questions or concerns that you may have regarding the information given to you following your procedure. If we do not reach you, we will leave a message.  However, if you are feeling well and you are not experiencing any problems, there is no need to return our call.  We will assume that you have returned to your regular daily activities without incident.  If any biopsies were taken you will be contacted by phone or by letter within the next 1-3 weeks.  Please call us at [phone] if you have not heard about the biopsies in 3 weeks.    SIGNATURES/CONFIDENTIALITY: You and/or your care partner have signed paperwork which will be entered into your electronic medical record.  These signatures attest to the fact that that the information above on your After Visit Summary has been reviewed and is understood.  Full responsibility of the confidentiality of this discharge information lies with you and/or your care-partner.  Polyp handout given Await pathology results  resume all medications and diet

## 2015-03-22 NOTE — Progress Notes (Signed)
A/ox3 pleased with MAC, report to Jill RN 

## 2015-03-22 NOTE — Op Note (Signed)
[facility]  [address] [place], [id number]   COLONOSCOPY PROCEDURE REPORT  PATIENT: [name], [name]  MR#: [id number] BIRTHDATE: 1970-08-06 , 66  yrs. old GENDER: female ENDOSCOPIST: Harl Bowie, MD REFERRED [id number], Margarita Rana, NP PROCEDURE DATE:  03/22/2015 PROCEDURE:   Colonoscopy, screening and Colonoscopy with snare polypectomy First Screening Colonoscopy - Avg.  risk and is 50 yrs.  old or older Yes.  Prior Negative Screening - Now for repeat screening. N/A  History of Adenoma - Now for follow-up colonoscopy & has been > or = to 3 yrs.  N/A  Polyps removed today? Yes ASA CLASS:   Class I INDICATIONS:Screening for colonic neoplasia and FH Colon or Rectal Adenocarcinoma. MEDICATIONS: Propofol 250 mg IV  DESCRIPTION OF PROCEDURE:   After the risks benefits and alternatives of the procedure were thoroughly explained, informed consent was obtained.  The digital rectal exam revealed no abnormalities of the rectum.   The LB [id number]  endoscope was introduced through the anus and advanced to the cecum, which was identified by both the appendix and ileocecal valve. No adverse events experienced.   The quality of the prep was good.  The instrument was then slowly withdrawn as the colon was fully examined. Estimated blood loss is zero unless otherwise noted in this procedure report.  COLON FINDINGS: The examined terminal ileum appeared to be normal. A pedunculated polyp measuring 25 mm in size was found in the sigmoid colon.  A polypectomy was performed using snare cautery. The resection was complete, the polyp tissue was completely retrieved and sent to histology.   Two sessile polyps ranging between 3-35mm in size were found in the sigmoid colon and transverse colon.  A polypectomy was performed with a cold snare. The resection was complete, the polyp tissue was completely retrieved and sent to histology.  Retroflexed views  revealed internal hemorrhoids. The time to cecum = 2.5 Withdrawal time = 13.0   The scope was withdrawn and the procedure completed. COMPLICATIONS: There were no immediate complications.  ENDOSCOPIC IMPRESSION: 1.   The examined terminal ileum appeared to be normal 2.   Pedunculated polyp was found in the sigmoid colon; polypectomy was performed using snare cautery 3.   Two sessile polyps ranging between 3-42mm in size were found in the sigmoid colon and transverse colon; polypectomy was performed with a cold snare  RECOMMENDATIONS: If the polyp(s) removed today are proven to be adenomatous (pre-cancerous) polyps, you will need a colonoscopy in 3 years. Otherwise you should continue to follow colorectal cancer screening guidelines for "high risk" patients with a colonoscopy in 5 years. You will receive a letter within 1-2 weeks with the results of your biopsy as well as final recommendations.  Please call my office if you have not received a letter after 3 weeks.  eSigned:  Harl Bowie, MD 03/22/2015 2:14 PM      PATIENT NAME:  [name], [name] MR#: [id number]

## 2015-03-22 NOTE — Progress Notes (Signed)
Called to room to assist during endoscopic procedure.  Patient ID and intended procedure confirmed with present staff. Received instructions for my participation in the procedure from the performing physician.  

## 2015-03-25 ENCOUNTER — Telehealth: Payer: Self-pay | Admitting: *Deleted

## 2015-03-25 NOTE — Telephone Encounter (Signed)
Left message, follow-up, number identifier 

## 2015-04-03 ENCOUNTER — Encounter: Payer: Self-pay | Admitting: Gastroenterology

## 2015-11-19 ENCOUNTER — Other Ambulatory Visit: Payer: Self-pay | Admitting: Primary Care

## 2015-11-19 DIAGNOSIS — Z0000 Encounter for general adult medical examination without abnormal findings: Secondary | ICD-10-CM

## 2015-11-19 DIAGNOSIS — E559 Vitamin D deficiency, unspecified: Secondary | ICD-10-CM

## 2015-11-28 ENCOUNTER — Other Ambulatory Visit (INDEPENDENT_AMBULATORY_CARE_PROVIDER_SITE_OTHER): Payer: BLUE CROSS/BLUE SHIELD

## 2015-11-28 DIAGNOSIS — E559 Vitamin D deficiency, unspecified: Secondary | ICD-10-CM | POA: Diagnosis not present

## 2015-11-28 DIAGNOSIS — Z0000 Encounter for general adult medical examination without abnormal findings: Secondary | ICD-10-CM

## 2015-11-28 LAB — VITAMIN D 25 HYDROXY (VIT D DEFICIENCY, FRACTURES): VITD: 23.08 ng/mL — AB (ref 30.00–100.00)

## 2015-11-28 LAB — COMPREHENSIVE METABOLIC PANEL
ALT: 14 U/L (ref 0–35)
AST: 13 U/L (ref 0–37)
Albumin: 4.1 g/dL (ref 3.5–5.2)
Alkaline Phosphatase: 71 U/L (ref 39–117)
BUN: 12 mg/dL (ref 6–23)
CALCIUM: 9.2 mg/dL (ref 8.4–10.5)
CHLORIDE: 104 meq/L (ref 96–112)
CO2: 27 meq/L (ref 19–32)
CREATININE: 0.69 mg/dL (ref 0.40–1.20)
GFR: 98.4 mL/min (ref >60.00)
Glucose, Bld: 91 mg/dL (ref 70–99)
POTASSIUM: 3.7 meq/L (ref 3.5–5.1)
Sodium: 138 mEq/L (ref 135–145)
Total Bilirubin: 0.3 mg/dL (ref 0.2–1.2)
Total Protein: 6.6 g/dL (ref 6.0–8.3)

## 2015-11-28 LAB — LIPID PANEL
CHOLESTEROL: 188 mg/dL (ref 0–200)
HDL: 38.9 mg/dL — AB (ref >39.00)
LDL CALC: 128 mg/dL — AB (ref 0–99)
NonHDL: 149.4
TRIGLYCERIDES: 108 mg/dL (ref 0.0–149.0)
Total CHOL/HDL Ratio: 5
VLDL: 21.6 mg/dL (ref 0.0–40.0)

## 2015-11-28 LAB — HEMOGLOBIN A1C: HEMOGLOBIN A1C: 5.3 % (ref 4.6–6.5)

## 2015-12-05 ENCOUNTER — Other Ambulatory Visit (HOSPITAL_COMMUNITY)
Admission: RE | Admit: 2015-12-05 | Discharge: 2015-12-05 | Disposition: A | Discharge status: Home / Self Care | Payer: BLUE CROSS/BLUE SHIELD | Source: Ambulatory Visit | Attending: Primary Care | Admitting: Primary Care

## 2015-12-05 ENCOUNTER — Encounter: Payer: Self-pay | Admitting: Primary Care

## 2015-12-05 ENCOUNTER — Ambulatory Visit (INDEPENDENT_AMBULATORY_CARE_PROVIDER_SITE_OTHER): Payer: BLUE CROSS/BLUE SHIELD | Admitting: Primary Care

## 2015-12-05 VITALS — BP 122/80 | HR 83 | Temp 98.4°F | Ht 61.0 in | Wt 156.0 lb

## 2015-12-05 DIAGNOSIS — R030 Elevated blood-pressure reading, without diagnosis of hypertension: Secondary | ICD-10-CM

## 2015-12-05 DIAGNOSIS — E559 Vitamin D deficiency, unspecified: Secondary | ICD-10-CM | POA: Diagnosis not present

## 2015-12-05 DIAGNOSIS — Z1151 Encounter for screening for human papillomavirus (HPV): Secondary | ICD-10-CM | POA: Diagnosis present

## 2015-12-05 DIAGNOSIS — Z0000 Encounter for general adult medical examination without abnormal findings: Secondary | ICD-10-CM

## 2015-12-05 DIAGNOSIS — Z124 Encounter for screening for malignant neoplasm of cervix: Secondary | ICD-10-CM

## 2015-12-05 DIAGNOSIS — Z01419 Encounter for gynecological examination (general) (routine) without abnormal findings: Secondary | ICD-10-CM | POA: Diagnosis present

## 2015-12-05 DIAGNOSIS — Z23 Encounter for immunization: Secondary | ICD-10-CM | POA: Diagnosis not present

## 2015-12-05 DIAGNOSIS — Z1239 Encounter for other screening for malignant neoplasm of breast: Secondary | ICD-10-CM

## 2015-12-05 DIAGNOSIS — Z1231 Encounter for screening mammogram for malignant neoplasm of breast: Secondary | ICD-10-CM

## 2015-12-05 NOTE — Progress Notes (Signed)
Pre visit review using our clinic review tool, if applicable. No additional management support is needed unless otherwise documented below in the visit note. 

## 2015-12-05 NOTE — Patient Instructions (Signed)
It's importance to improve your diet by reducing consumption of fast food, fried food, processed snack foods, sugary drinks. Increase consumption of fresh vegetables and fruits, whole grains, water.  Ensure you are drinking 64 ounces of water daily.  Start exercising. You should be getting 150 minutes of moderate intensity exercise weekly.  You will be contacted regarding your mammogram.  Please let us know if you have not heard back within one week.   We will notify you of your pap results once received.  Continue Vitamin D 1000 units daily for low levels.  Follow up in 1 year for annual physical or sooner if needed.  It was a pleasure to see you today!

## 2015-12-05 NOTE — Assessment & Plan Note (Signed)
Level of 20 on recent labs. Will have her restart vitamin d 1000 units daily as she's taken this infrequently.

## 2015-12-05 NOTE — Progress Notes (Signed)
Subjective:    Patient ID: [name], female    DOB: 1970/10/05, 45 y.o.   MRN: [id number]  HPI  [name] is a 45 year old female who presents today for complete physical.  Immunizations: -Tetanus: Completed in 2016 -Influenza: Due.   Diet: She endorses a healthy diet. Breakfast: Crackers Lunch: Veggie chips Dinner: Meat, french fries, hamburger helper, starch, vegetable Snacks: Crackers, chips Desserts: Occasionally cookies/ice cream Beverages: Soda, sweet tea, no water.  Exercise: She does not currently exercise Eye exam: Completed over 1 year ago Dental exam: Completes semi-annually Pap Smear: Due today. Mammogram: Has never completed.   Review of Systems  Constitutional: Negative for unexpected weight change.  HENT: Negative for rhinorrhea.   Respiratory: Negative for cough and shortness of breath.   Cardiovascular: Negative for chest pain.  Gastrointestinal: Negative for constipation and diarrhea.  Genitourinary: Negative for difficulty urinating and menstrual problem.       Has noticed irregular periods over the last 2-3 months.   Musculoskeletal: Negative for arthralgias and myalgias.  Skin: Negative for rash.  Allergic/Immunologic: Negative for environmental allergies.  Neurological: Negative for dizziness, numbness and headaches.  Psychiatric/Behavioral:       Denies concerns for anxiety or depression       No past medical history on file.   Social History   Social History  . Marital status: Married    Spouse name: N/A  . Number of children: 2  . Years of education: N/A   Occupational History  . rare book conservator    Social History Main Topics  . Smoking status: Never Smoker  . Smokeless tobacco: Never Used  . Alcohol use No  . Drug use: No  . Sexual activity: Not on file   Other Topics Concern  . Not on file   Social History Narrative   Married.   2 children.   Works as a Building control surveyor.   Masters Degree   Enjoys  spending time outside, once painted.       Past Surgical History:  Procedure Laterality Date  . CHOLECYSTECTOMY  2007  . WISDOM TOOTH EXTRACTION      Family History  Problem Relation Age of Onset  . Hypertension Father   . Heart disease Father     Myocardial infarction  . Hypercholesterolemia Father   . Colon cancer Father     in his 51s  . Anxiety disorder Sister   . Diabetes Maternal Grandmother     Deceased  . Lung cancer Maternal Grandfather   . Emphysema Paternal Grandmother     Allergies  Allergen Reactions  . Sulfa Antibiotics     Current Outpatient Prescriptions on File Prior to Visit  Medication Sig Dispense Refill  . cholecalciferol (VITAMIN D) 1000 units tablet Take 1,000 Units by mouth daily. 2,000 u. Daily     No current facility-administered medications on file prior to visit.     BP 122/80   Pulse 83   Temp 98.4 F (36.9 C) (Oral)   Ht 5\' 1"  (1.549 m)   Wt 156 lb (70.8 kg)   LMP 11/27/2015   SpO2 99%   BMI 29.48 kg/m    Objective:   Physical Exam  Constitutional: She is oriented to person, place, and time. She appears well-nourished.  HENT:  Right Ear: Tympanic membrane and ear canal normal.  Left Ear: Tympanic membrane and ear canal normal.  Nose: Nose normal.  Mouth/Throat: Oropharynx is clear and moist.  Eyes: Conjunctivae  and EOM are normal. Pupils are equal, round, and reactive to light.  Neck: Neck supple. No thyromegaly present.  Cardiovascular: Normal rate and regular rhythm.   No murmur heard. Pulmonary/Chest: Effort normal and breath sounds normal. She has no rales.  Abdominal: Soft. Bowel sounds are normal. There is no tenderness.  Genitourinary: There is no lesion on the right labia. There is no lesion on the left labia. Cervix exhibits no motion tenderness and no discharge. Right adnexum displays no tenderness. Left adnexum displays no tenderness. No vaginal discharge found.  Musculoskeletal: Normal range of motion.    Lymphadenopathy:    She has no cervical adenopathy.  Neurological: She is alert and oriented to person, place, and time. She has normal reflexes. No cranial nerve deficit.  Skin: Skin is warm and dry. No rash noted.  Psychiatric: She has a normal mood and affect.          Assessment & Plan:

## 2015-12-05 NOTE — Assessment & Plan Note (Signed)
Td UTD, influenza provided today. Mammogram due, ordered. Pap due, completed today. Discussed the importance of a healthy diet and regular exercise in order for weight loss, and to reduce the risk of other medical diseases. Exam unremarkable. Labs with low vitamin d, otherwise unremarkable. Follow up in 1 year for repeat physical.

## 2015-12-05 NOTE — Addendum Note (Signed)
Addended by: Jacqualin Combes on: 12/05/2015 08:40 AM   Modules accepted: Orders

## 2015-12-05 NOTE — Assessment & Plan Note (Signed)
Stable today. Continue to monitor.  ?

## 2015-12-06 LAB — CYTOLOGY - PAP
Diagnosis: NEGATIVE
HPV: NOT DETECTED

## 2015-12-09 ENCOUNTER — Encounter: Payer: Self-pay | Admitting: *Deleted

## 2016-01-23 ENCOUNTER — Ambulatory Visit
Admission: RE | Admit: 2016-01-23 | Discharge: 2016-01-23 | Disposition: A | Discharge status: Home / Self Care | Payer: BLUE CROSS/BLUE SHIELD | Source: Ambulatory Visit | Attending: Primary Care | Admitting: Primary Care

## 2016-01-23 DIAGNOSIS — Z1239 Encounter for other screening for malignant neoplasm of breast: Secondary | ICD-10-CM

## 2016-11-30 ENCOUNTER — Ambulatory Visit: Payer: Self-pay | Admitting: *Deleted

## 2016-11-30 ENCOUNTER — Encounter: Payer: Self-pay | Admitting: Primary Care

## 2016-11-30 ENCOUNTER — Telehealth: Payer: Self-pay

## 2016-11-30 ENCOUNTER — Encounter: Payer: Self-pay | Admitting: Family Medicine

## 2016-11-30 ENCOUNTER — Telehealth: Payer: Self-pay | Admitting: Primary Care

## 2016-11-30 ENCOUNTER — Ambulatory Visit: Payer: BLUE CROSS/BLUE SHIELD | Admitting: Family Medicine

## 2016-11-30 ENCOUNTER — Other Ambulatory Visit: Payer: Self-pay | Admitting: Internal Medicine

## 2016-11-30 VITALS — BP 138/78 | HR 105 | Temp 99.1°F | Wt 154.5 lb

## 2016-11-30 DIAGNOSIS — Z0000 Encounter for general adult medical examination without abnormal findings: Secondary | ICD-10-CM

## 2016-11-30 DIAGNOSIS — B0230 Zoster ocular disease, unspecified: Secondary | ICD-10-CM

## 2016-11-30 MED ORDER — GABAPENTIN 100 MG PO CAPS: 100.0000 mg | ORAL_CAPSULE | Freq: Three times a day (TID) | ORAL | 1 refills | Status: DC

## 2016-11-30 MED ORDER — VALACYCLOVIR HCL 1 G PO TABS: 1000.0000 mg | ORAL_TABLET | Freq: Three times a day (TID) | ORAL | 0 refills | Status: DC

## 2016-11-30 NOTE — Telephone Encounter (Signed)
PLEASE NOTE: All timestamps contained within this report are represented as [place] Standard Time. CONFIDENTIALTY NOTICE: This fax transmission is intended only for the addressee. It contains information that is legally privileged, confidential or otherwise protected from use or disclosure. If you are not the intended recipient, you are strictly prohibited from reviewing, disclosing, copying using or disseminating any of this information or taking any action in reliance on or regarding this information. If you have received this fax in error, please notify us immediately by telephone so that we can arrange for its return to [place]. Phone: [phone], Toll-Free: [phone], Fax: [phone] Page: 1 of 1 Call Id: [id number] [place] Night - Client Nonclinical Telephone Record [place] Night - Client Client Site [facility] [facility] - PHYSICIAN, Verita Schneiders- MD Contact Type Call Who Is Calling Patient / Member / Family / Caregiver Caller Name [place] Phone Number [phone] Patient Name Cathye Kreiter Call Type Message Only Information Provided Reason for Call Request to Schedule Office Appointment Initial Comment Caller states she would like to make an appointment. Additional Comment Call Closed By: Renaye Rakers Transaction Date/Time: 11/29/2016 3:37:08 PM (ET)

## 2016-11-30 NOTE — Patient Instructions (Signed)
Start valtrex, take 1 tab 3 times a day.  Gradually increase gabapentin as tolerated for pain.  Rosaria Ferries will call about your referral. Take care.  Glad to see you.

## 2016-11-30 NOTE — Telephone Encounter (Signed)
Left detailed message on voicemail.  

## 2016-11-30 NOTE — Telephone Encounter (Signed)
Pt has appt 11/30/16 at 10:30 with Dr Damita Dunnings.

## 2016-11-30 NOTE — Progress Notes (Signed)
Started with pain initially, for a few days.  Only on the R side of the face.  Then noted scalp rash, facial rash/bumps. R face and scalp is sensitive.  R eye pain, photosensitive.  No change in vision but eye feels irritated and watering more.  She had varicella in childhood.  Now her pain is 6-7/10.  No L sided sx.  She can't sleep from the pain.    Meds, vitals, and allergies reviewed.   ROS: Per HPI unless specifically indicated in ROS section   R facial rash and altered sensation with R eye conjunctival injection noted.  TM wnl B EOMI B OP wnl Neck supple with single LN on R side of neck noted.  rrr

## 2016-11-30 NOTE — Telephone Encounter (Signed)
Okay with me, assuming the eye clinic doesn't hold her out, she feels well enough, and she isn't around young  unvaccinated kids.  Thanks.

## 2016-11-30 NOTE — Telephone Encounter (Signed)
Starting last Wednesday have sharp pains in right side of head with a rash with raised painful blisters spreading from into scalp down around the right eye.   The sharp pains went away over the weekend but the rash is spreading and becoming very painful.   Lymph node in right neck also sore and swollen.  Right eye is watering this morning.  Denies any other symptoms of URI.  Appt made with Dr. Damita Dunnings for 10:30am today. Reason for Disposition . [1] Shingles rash (matches SYMPTOMS) AND [2] onset within past 72 hours  Answer Assessment - Initial Assessment Questions 1. APPEARANCE of RASH: "Describe the rash."      Raised red  Pain ful bumps in  2. LOCATION: "Where is the rash located?"      Right scalp to around right eye.  Eye watering.  Been having sharp headache pains over the weekend that have now gone away.  Bu the rash is painful 3. NUMBER: "How many spots are there?"      In scalp and around right eye 4. SIZE: "How big are the spots?" (Inches, centimeters or compare to size of a coin)      Forhead large and raised up  5. ONSET: "When did the rash start?"      Started last Wednesday 6. ITCHING: "Does the rash itch?" If so, ask: "How bad is the itch?"  (Scale 1-10; or mild, moderate, severe)     Painful does not itch 7. PAIN: "Does the rash hurt?" If so, ask: "How bad is the pain?"  (Scale 1-10; or mild, moderate, severe)     Yes  7 8. OTHER SYMPTOMS: "Do you have any other symptoms?" (e.g., fever)     Right neck lymph node swolling and sore.   Pain medication not helping.   Ibuprofen not helping. 9. PREGNANCY: "Is there any chance you are pregnant?" "When was your last menstrual period?"     No.  Just finished.  Protocols used: SHINGLES-A-AH, RASH OR REDNESS - LOCALIZED-A-AH

## 2016-11-30 NOTE — Telephone Encounter (Unsigned)
Copied from [place] [id number]. Topic: Inquiry >> Nov 30, 2016 12:55 PM [name] wrote: Pt needs to know if it will be ok to go back tomorrow with having the shingles and medication prescribed to her from her visit today?

## 2016-12-02 ENCOUNTER — Telehealth: Payer: Self-pay | Admitting: Family Medicine

## 2016-12-02 DIAGNOSIS — B0230 Zoster ocular disease, unspecified: Secondary | ICD-10-CM | POA: Insufficient documentation

## 2016-12-02 DIAGNOSIS — Z8619 Personal history of other infectious and parasitic diseases: Secondary | ICD-10-CM | POA: Insufficient documentation

## 2016-12-02 NOTE — Telephone Encounter (Signed)
Unable to reach pt for update on how doing with shingles; left v/m for pt to cb.pt has CPX with Allie Bossier NP on 12/11/16. Pt saw Dr Damita Dunnings with shingles on 11/30/16.

## 2016-12-02 NOTE — Assessment & Plan Note (Signed)
Start valtrex, take 1 tab 3 times a day.  Gradually increase gabapentin as tolerated for pain.  Diagnosis and treatment discussed with patient.  Refer to ophthalmology.  Routine cautions and instructions given. Update [place] as needed.

## 2016-12-02 NOTE — Telephone Encounter (Signed)
Copied from [place] [id number]. Topic: Appointment Scheduling - Scheduling Inquiry for Clinic >> Dec 02, 2016  2:22 PM Hewitt Shorts wrote: Reason for CRM: pt is calling to see if Dr. Damita Dunnings needed her to schedule a follow up appt on shingles or can this all be done when she comes into the office on 12/07/16 for lab work  Best number [phone]

## 2016-12-03 NOTE — Telephone Encounter (Signed)
Patient advised.

## 2016-12-07 ENCOUNTER — Other Ambulatory Visit (INDEPENDENT_AMBULATORY_CARE_PROVIDER_SITE_OTHER): Payer: BLUE CROSS/BLUE SHIELD

## 2016-12-07 DIAGNOSIS — Z0000 Encounter for general adult medical examination without abnormal findings: Secondary | ICD-10-CM

## 2016-12-07 LAB — CBC
HCT: 41.2 % (ref 36.0–46.0)
HEMOGLOBIN: 13.4 g/dL (ref 12.0–15.0)
MCHC: 32.5 g/dL (ref 30.0–36.0)
MCV: 85.3 fl (ref 78.0–100.0)
PLATELETS: 172 10*3/uL (ref 150.0–400.0)
RBC: 4.83 Mil/uL (ref 3.87–5.11)
RDW: 14.5 % (ref 11.5–15.5)
WBC: 6.3 10*3/uL (ref 4.0–10.5)

## 2016-12-07 LAB — COMPREHENSIVE METABOLIC PANEL
ALBUMIN: 4 g/dL (ref 3.5–5.2)
ALK PHOS: 68 U/L (ref 39–117)
ALT: 13 U/L (ref 0–35)
AST: 13 U/L (ref 0–37)
BILIRUBIN TOTAL: 0.3 mg/dL (ref 0.2–1.2)
BUN: 7 mg/dL (ref 6–23)
CALCIUM: 8.9 mg/dL (ref 8.4–10.5)
CHLORIDE: 105 meq/L (ref 96–112)
CO2: 26 mEq/L (ref 19–32)
CREATININE: 0.75 mg/dL (ref 0.40–1.20)
GFR: 88.95 mL/min (ref >60.00)
Glucose, Bld: 98 mg/dL (ref 70–99)
POTASSIUM: 4.1 meq/L (ref 3.5–5.1)
Sodium: 138 mEq/L (ref 135–145)
Total Protein: 6.8 g/dL (ref 6.0–8.3)

## 2016-12-07 LAB — LIPID PANEL
CHOLESTEROL: 188 mg/dL (ref 0–200)
HDL: 33.1 mg/dL — AB (ref >39.00)
LDL CALC: 138 mg/dL — AB (ref 0–99)
NonHDL: 154.64
TRIGLYCERIDES: 83 mg/dL (ref 0.0–149.0)
Total CHOL/HDL Ratio: 6
VLDL: 16.6 mg/dL (ref 0.0–40.0)

## 2016-12-07 LAB — VITAMIN D 25 HYDROXY (VIT D DEFICIENCY, FRACTURES): VITD: 15.45 ng/mL — ABNORMAL LOW (ref 30.00–100.00)

## 2016-12-11 ENCOUNTER — Ambulatory Visit (INDEPENDENT_AMBULATORY_CARE_PROVIDER_SITE_OTHER): Payer: BLUE CROSS/BLUE SHIELD | Admitting: Primary Care

## 2016-12-11 ENCOUNTER — Encounter: Payer: Self-pay | Admitting: Primary Care

## 2016-12-11 VITALS — BP 116/70 | HR 101 | Temp 98.5°F | Ht 61.0 in | Wt 156.4 lb

## 2016-12-11 DIAGNOSIS — Z0000 Encounter for general adult medical examination without abnormal findings: Secondary | ICD-10-CM

## 2016-12-11 DIAGNOSIS — R030 Elevated blood-pressure reading, without diagnosis of hypertension: Secondary | ICD-10-CM

## 2016-12-11 DIAGNOSIS — E785 Hyperlipidemia, unspecified: Secondary | ICD-10-CM | POA: Diagnosis not present

## 2016-12-11 DIAGNOSIS — E559 Vitamin D deficiency, unspecified: Secondary | ICD-10-CM

## 2016-12-11 DIAGNOSIS — Z23 Encounter for immunization: Secondary | ICD-10-CM | POA: Diagnosis not present

## 2016-12-11 DIAGNOSIS — Z1239 Encounter for other screening for malignant neoplasm of breast: Secondary | ICD-10-CM

## 2016-12-11 DIAGNOSIS — Z1231 Encounter for screening mammogram for malignant neoplasm of breast: Secondary | ICD-10-CM

## 2016-12-11 DIAGNOSIS — B0230 Zoster ocular disease, unspecified: Secondary | ICD-10-CM | POA: Diagnosis not present

## 2016-12-11 NOTE — Assessment & Plan Note (Signed)
Non compliant to vitamin D. Will have her resume regimen.

## 2016-12-11 NOTE — Assessment & Plan Note (Signed)
Td UTD, influenza vaccination provided today. Mammogram due, pending. Pap UTD. Discussed the importance of a healthy diet and regular exercise in order for weight loss, and to reduce the risk of other medical problems. Exam mostly unremarkable. Labs with mild hyperlipidemia. Follow up in 1 year.

## 2016-12-11 NOTE — Assessment & Plan Note (Signed)
Stable in the office today, continue to monitor.  

## 2016-12-11 NOTE — Progress Notes (Signed)
Subjective:    Patient ID: TRINITA DEVLIN, female    DOB: 07/13/1970, 46 y.o.   MRN: [id number]  HPI  Ms. Evans is a 46 year old female who presents today for complete physical.  Immunizations: -Tetanus: Completed in 2016 -Influenza: Due   Diet: She endorses a healthy diet. Breakfast: Crackers Lunch: Crackers Dinner: Noodles, chicken, pasta, little vegetables Snacks: Chips Desserts: Ice cream, pie; daily Beverages: [employer], no water  Exercise: She is not currently exercising Eye exam: Completed several years ago Dental exam: Completes semi-annually Pap Smear: Completed in 2017 Mammogram: Due this December    Review of Systems  Constitutional: Negative for unexpected weight change.  HENT: Negative for rhinorrhea.   Respiratory: Negative for cough and shortness of breath.   Cardiovascular: Negative for chest pain.  Gastrointestinal: Negative for constipation and diarrhea.  Genitourinary: Negative for difficulty urinating and menstrual problem.  Musculoskeletal: Negative for arthralgias and myalgias.  Skin:       Healing herpes zoster  Allergic/Immunologic: Negative for environmental allergies.  Neurological: Negative for dizziness, numbness and headaches.  Psychiatric/Behavioral:       Denies concerns for anxiety and depression       No past medical history on file.   Social History   Socioeconomic History  . Marital status: Married    Spouse name: Not on file  . Number of children: 2  . Years of education: Not on file  . Highest education level: Not on file  Social Needs  . Financial resource strain: Not on file  . Food insecurity - worry: Not on file  . Food insecurity - inability: Not on file  . Transportation needs - medical: Not on file  . Transportation needs - non-medical: Not on file  Occupational History  . Occupation: rare book conservator  Tobacco Use  . Smoking status: Never Smoker  . Smokeless tobacco: Never Used  Substance and  Sexual Activity  . Alcohol use: No    Alcohol/week: 0.0 oz  . Drug use: No  . Sexual activity: Not on file  Other Topics Concern  . Not on file  Social History Narrative   Married.   2 children.   Works as a Building control surveyor.   Masters Degree   Enjoys spending time outside, once painted.    Past Surgical History:  Procedure Laterality Date  . CHOLECYSTECTOMY  2007  . WISDOM TOOTH EXTRACTION      Family History  Problem Relation Age of Onset  . Hypertension Father   . Heart disease Father        Myocardial infarction  . Hypercholesterolemia Father   . Colon cancer Father        in his 76s  . Anxiety disorder Sister   . Diabetes Maternal Grandmother        Deceased  . Lung cancer Maternal Grandfather   . Emphysema Paternal Grandmother     Allergies  Allergen Reactions  . Sulfa Antibiotics     Current Outpatient Medications on File Prior to Visit  Medication Sig Dispense Refill  . cholecalciferol (VITAMIN D) 1000 units tablet Take 1,000 Units by mouth daily. 2,000 u. Daily    . gabapentin (NEURONTIN) 100 MG capsule Take 1-3 capsules (100-300 mg total) 3 (three) times daily by mouth. 40 capsule 1   No current facility-administered medications on file prior to visit.     BP 116/70   Pulse (!) 101   Temp 98.5 F (36.9 C) (Oral)  Ht 5\' 1"  (1.549 m)   Wt 156 lb 6.4 oz (70.9 kg)   LMP 11/27/2016   SpO2 98%   BMI 29.55 kg/m    Objective:   Physical Exam  Constitutional: She is oriented to person, place, and time. She appears well-nourished.  HENT:  Right Ear: Tympanic membrane and ear canal normal.  Left Ear: Tympanic membrane and ear canal normal.  Nose: Nose normal.  Mouth/Throat: Oropharynx is clear and moist.  Eyes: Conjunctivae and EOM are normal. Pupils are equal, round, and reactive to light.  Neck: Neck supple. No thyromegaly present.  Cardiovascular: Normal rate and regular rhythm.  No murmur heard. Pulmonary/Chest: Effort normal and  breath sounds normal. She has no rales.  Abdominal: Soft. Bowel sounds are normal. There is no tenderness.  Musculoskeletal: Normal range of motion.  Lymphadenopathy:    She has no cervical adenopathy.  Neurological: She is alert and oriented to person, place, and time. She has normal reflexes. No cranial nerve deficit.  Skin: Skin is warm and dry.  Mild healing flesh colored herpes zoster above right eye  Psychiatric: She has a normal mood and affect.          Assessment & Plan:

## 2016-12-11 NOTE — Patient Instructions (Addendum)
Start exercising. You should be getting 150 minutes of moderate intensity exercise weekly.  Increase consumption of vegetables, fruit, whole grains.  Ensure you are consuming 64 ounces of water daily.  Restart your vitamin D.  Call the breast center to schedule your mammogram.  Follow up in 1 year for your annual exam or sooner if needed.  It was a pleasure to see you today!

## 2016-12-11 NOTE — Addendum Note (Signed)
Addended by: Jacqualin Combes on: 12/11/2016 04:35 PM   Modules accepted: Orders

## 2016-12-11 NOTE — Assessment & Plan Note (Signed)
Following with opthalmology.  Rash healing well.

## 2016-12-11 NOTE — Assessment & Plan Note (Signed)
LDL slightly above goal, HDL too low. Discussed to start exercising and improve diet. Will continue to monitor.

## 2016-12-24 ENCOUNTER — Telehealth: Payer: Self-pay | Admitting: Primary Care

## 2016-12-24 NOTE — Telephone Encounter (Signed)
Copied from [place] [id number]. Topic: Quick Communication - See Telephone Encounter >> Dec 24, 2016  3:07 PM Bea Graff, NT wrote: CRM for notification. See Telephone encounter for: Patient needing a refill of her gabapentin. Also wants to see if maybe she can be ordered something else. She can only take 1 of these at a time or she is extremely tired. She is getting over shingles and states that the nerve in her right eye has still not gotten better. She is very sensitive to light and the eye dr told her that her pcp could order something for her if the gabapentin wasn't helping her eye. [facility] uses [facility] on [address].   12/24/16.

## 2016-12-25 ENCOUNTER — Encounter: Payer: Self-pay | Admitting: Primary Care

## 2016-12-25 DIAGNOSIS — B0230 Zoster ocular disease, unspecified: Secondary | ICD-10-CM

## 2016-12-25 NOTE — Telephone Encounter (Signed)
Will speak with patient via My Chart.

## 2016-12-28 ENCOUNTER — Encounter: Payer: Self-pay | Admitting: Primary Care

## 2016-12-28 MED ORDER — GABAPENTIN 100 MG PO CAPS: ORAL_CAPSULE | ORAL | 0 refills | Status: DC

## 2017-01-05 ENCOUNTER — Encounter: Payer: Self-pay | Admitting: Primary Care

## 2017-01-29 ENCOUNTER — Other Ambulatory Visit: Payer: Self-pay | Admitting: Primary Care

## 2017-01-29 ENCOUNTER — Ambulatory Visit: Payer: BLUE CROSS/BLUE SHIELD

## 2017-01-29 DIAGNOSIS — B0230 Zoster ocular disease, unspecified: Secondary | ICD-10-CM

## 2017-01-29 MED ORDER — GABAPENTIN 100 MG PO CAPS: ORAL_CAPSULE | ORAL | 0 refills | Status: DC

## 2017-01-29 NOTE — Telephone Encounter (Signed)
Refill sent to pharmacy.   

## 2017-01-29 NOTE — Telephone Encounter (Signed)
Ok to refill? Electronically refill request for gabapentin (NEURONTIN) 100 MG capsule  Last prescribed on 12/28/2016

## 2017-02-19 ENCOUNTER — Ambulatory Visit
Admission: RE | Admit: 2017-02-19 | Discharge: 2017-02-19 | Disposition: A | Discharge status: Home / Self Care | Payer: BLUE CROSS/BLUE SHIELD | Source: Ambulatory Visit | Attending: Primary Care | Admitting: Primary Care

## 2017-02-19 DIAGNOSIS — Z1239 Encounter for other screening for malignant neoplasm of breast: Secondary | ICD-10-CM

## 2017-03-03 ENCOUNTER — Other Ambulatory Visit: Payer: Self-pay | Admitting: Primary Care

## 2017-03-03 ENCOUNTER — Encounter: Payer: Self-pay | Admitting: Primary Care

## 2017-03-03 DIAGNOSIS — B0230 Zoster ocular disease, unspecified: Secondary | ICD-10-CM

## 2017-03-03 NOTE — Telephone Encounter (Signed)
Will send patient My Chart message to see if this is needed.

## 2017-03-03 NOTE — Telephone Encounter (Signed)
Ok to refill? Electronically refill request for gabapentin (NEURONTIN) 100 MG capsule  Last prescribed on 01/29/2017. Last seen on 12/11/2016

## 2017-03-04 MED ORDER — GABAPENTIN 100 MG PO CAPS: ORAL_CAPSULE | ORAL | 0 refills | Status: DC

## 2017-04-04 ENCOUNTER — Other Ambulatory Visit: Payer: Self-pay | Admitting: Primary Care

## 2017-04-04 DIAGNOSIS — B0230 Zoster ocular disease, unspecified: Secondary | ICD-10-CM

## 2017-04-05 MED ORDER — GABAPENTIN 100 MG PO CAPS: ORAL_CAPSULE | ORAL | 0 refills | Status: DC

## 2017-04-05 NOTE — Telephone Encounter (Signed)
Noted, refills sent to pharmacy. 

## 2017-04-05 NOTE — Telephone Encounter (Signed)
Ok to refill? Electronically refill request. Last prescribed on 03/04/2017. Last seen on 12/11/2016

## 2017-11-30 ENCOUNTER — Other Ambulatory Visit: Payer: Self-pay | Admitting: Primary Care

## 2017-11-30 DIAGNOSIS — E785 Hyperlipidemia, unspecified: Secondary | ICD-10-CM

## 2017-11-30 DIAGNOSIS — E559 Vitamin D deficiency, unspecified: Secondary | ICD-10-CM

## 2017-12-10 ENCOUNTER — Other Ambulatory Visit (INDEPENDENT_AMBULATORY_CARE_PROVIDER_SITE_OTHER): Payer: Self-pay

## 2017-12-10 DIAGNOSIS — E785 Hyperlipidemia, unspecified: Secondary | ICD-10-CM

## 2017-12-10 DIAGNOSIS — E559 Vitamin D deficiency, unspecified: Secondary | ICD-10-CM

## 2017-12-10 LAB — COMPREHENSIVE METABOLIC PANEL
ALT: 18 U/L (ref 0–35)
AST: 17 U/L (ref 0–37)
Albumin: 4.3 g/dL (ref 3.5–5.2)
Alkaline Phosphatase: 67 U/L (ref 39–117)
BUN: 12 mg/dL (ref 6–23)
CALCIUM: 8.8 mg/dL (ref 8.4–10.5)
CHLORIDE: 105 meq/L (ref 96–112)
CO2: 26 meq/L (ref 19–32)
Creatinine, Ser: 0.86 mg/dL (ref 0.40–1.20)
GFR: 75.61 mL/min (ref >60.00)
GLUCOSE: 97 mg/dL (ref 70–99)
Potassium: 4.1 mEq/L (ref 3.5–5.1)
Sodium: 138 mEq/L (ref 135–145)
Total Bilirubin: 0.4 mg/dL (ref 0.2–1.2)
Total Protein: 7 g/dL (ref 6.0–8.3)

## 2017-12-10 LAB — LIPID PANEL
Cholesterol: 196 mg/dL (ref 0–200)
HDL: 35.7 mg/dL — AB (ref >39.00)
LDL CALC: 140 mg/dL — AB (ref 0–99)
NONHDL: 160.23
Total CHOL/HDL Ratio: 5
Triglycerides: 99 mg/dL (ref 0.0–149.0)
VLDL: 19.8 mg/dL (ref 0.0–40.0)

## 2017-12-10 LAB — VITAMIN D 25 HYDROXY (VIT D DEFICIENCY, FRACTURES): VITD: 35.61 ng/mL (ref 30.00–100.00)

## 2017-12-17 ENCOUNTER — Ambulatory Visit: Payer: Self-pay | Admitting: Primary Care

## 2017-12-17 ENCOUNTER — Encounter: Payer: Self-pay | Admitting: Primary Care

## 2017-12-17 ENCOUNTER — Encounter: Payer: BLUE CROSS/BLUE SHIELD | Admitting: Primary Care

## 2017-12-17 VITALS — BP 122/76 | HR 83 | Temp 98.3°F | Ht 60.0 in | Wt 159.0 lb

## 2017-12-17 DIAGNOSIS — B0230 Zoster ocular disease, unspecified: Secondary | ICD-10-CM

## 2017-12-17 DIAGNOSIS — Z23 Encounter for immunization: Secondary | ICD-10-CM

## 2017-12-17 DIAGNOSIS — E559 Vitamin D deficiency, unspecified: Secondary | ICD-10-CM

## 2017-12-17 DIAGNOSIS — R030 Elevated blood-pressure reading, without diagnosis of hypertension: Secondary | ICD-10-CM

## 2017-12-17 DIAGNOSIS — Z0000 Encounter for general adult medical examination without abnormal findings: Secondary | ICD-10-CM

## 2017-12-17 DIAGNOSIS — E785 Hyperlipidemia, unspecified: Secondary | ICD-10-CM

## 2017-12-17 NOTE — Assessment & Plan Note (Signed)
Normotensive today. Encouraged a healthy lifestyle.

## 2017-12-17 NOTE — Addendum Note (Signed)
Addended by: Jacqualin Combes on: 12/17/2017 08:33 AM   Modules accepted: Orders

## 2017-12-17 NOTE — Assessment & Plan Note (Signed)
Not compliant to oral vitamin D consistently. Encouraged her to continue on a daily basis. Continue to monitor.

## 2017-12-17 NOTE — Assessment & Plan Note (Signed)
Tetanus UTD, influenza vaccination due and provided. Pap smear UTD, due in 2020. Mammogram UTD, she opts to be screened every other year, due in 2021. Encouraged a healthy diet and regular exercise. Exam unremarkable. Labs reviewed.  Follow up in 1 year for CPE.

## 2017-12-17 NOTE — Patient Instructions (Signed)
Start exercising. You should be getting 150 minutes of moderate intensity exercise weekly.  It's important to improve your diet by reducing consumption of fast food, fried food, processed snack foods, sugary drinks. Increase consumption of fresh vegetables and fruits, whole grains, water.  Ensure you are drinking 64 ounces of water daily.  We will see you in one year for your annual exam or sooner if needed.  It was a pleasure to see you today!   Preventive Care 40-64 Years, Female Preventive care refers to lifestyle choices and visits with your health care provider that can promote health and wellness. What does preventive care include?  A yearly physical exam. This is also called an annual well check.  Dental exams once or twice a year.  Routine eye exams. Ask your health care provider how often you should have your eyes checked.  Personal lifestyle choices, including: ? Daily care of your teeth and gums. ? Regular physical activity. ? Eating a healthy diet. ? Avoiding tobacco and drug use. ? Limiting alcohol use. ? Practicing safe sex. ? Taking low-dose aspirin daily starting at age 51. ? Taking vitamin and mineral supplements as recommended by your health care provider. What happens during an annual well check? The services and screenings done by your health care provider during your annual well check will depend on your age, overall health, lifestyle risk factors, and family history of disease. Counseling Your health care provider may ask you questions about your:  Alcohol use.  Tobacco use.  Drug use.  Emotional well-being.  Home and relationship well-being.  Sexual activity.  Eating habits.  Work and work Statistician.  Method of birth control.  Menstrual cycle.  Pregnancy history.  Screening You may have the following tests or measurements:  Height, weight, and BMI.  Blood pressure.  Lipid and cholesterol levels. These may be checked every 5  years, or more frequently if you are over 33 years old.  Skin check.  Lung cancer screening. You may have this screening every year starting at age 18 if you have a 30-pack-year history of smoking and currently smoke or have quit within the past 15 years.  Fecal occult blood test (FOBT) of the stool. You may have this test every year starting at age 53.  Flexible sigmoidoscopy or colonoscopy. You may have a sigmoidoscopy every 5 years or a colonoscopy every 10 years starting at age 80.  Hepatitis C blood test.  Hepatitis B blood test.  Sexually transmitted disease (STD) testing.  Diabetes screening. This is done by checking your blood sugar (glucose) after you have not eaten for a while (fasting). You may have this done every 1-3 years.  Mammogram. This may be done every 1-2 years. Talk to your health care provider about when you should start having regular mammograms. This may depend on whether you have a family history of breast cancer.  BRCA-related cancer screening. This may be done if you have a family history of breast, ovarian, tubal, or peritoneal cancers.  Pelvic exam and Pap test. This may be done every 3 years starting at age 12. Starting at age 93, this may be done every 5 years if you have a Pap test in combination with an HPV test.  Bone density scan. This is done to screen for osteoporosis. You may have this scan if you are at high risk for osteoporosis.  Discuss your test results, treatment options, and if necessary, the need for more tests with your health care provider. Vaccines  Your health care provider may recommend certain vaccines, such as:  Influenza vaccine. This is recommended every year.  Tetanus, diphtheria, and acellular pertussis (Tdap, Td) vaccine. You may need a Td booster every 10 years.  Varicella vaccine. You may need this if you have not been vaccinated.  Zoster vaccine. You may need this after age 90.  Measles, mumps, and rubella (MMR)  vaccine. You may need at least one dose of MMR if you were born in 1957 or later. You may also need a second dose.  Pneumococcal 13-valent conjugate (PCV13) vaccine. You may need this if you have certain conditions and were not previously vaccinated.  Pneumococcal polysaccharide (PPSV23) vaccine. You may need one or two doses if you smoke cigarettes or if you have certain conditions.  Meningococcal vaccine. You may need this if you have certain conditions.  Hepatitis A vaccine. You may need this if you have certain conditions or if you travel or work in places where you may be exposed to hepatitis A.  Hepatitis B vaccine. You may need this if you have certain conditions or if you travel or work in places where you may be exposed to hepatitis B.  Haemophilus influenzae type b (Hib) vaccine. You may need this if you have certain conditions.  Talk to your health care provider about which screenings and vaccines you need and how often you need them. This information is not intended to replace advice given to you by your health care provider. Make sure you discuss any questions you have with your health care provider. Document Released: 02/08/2015 Document Revised: 10/02/2015 Document Reviewed: 11/13/2014 Elsevier Interactive Patient Education  [employer].

## 2017-12-17 NOTE — Assessment & Plan Note (Signed)
Stable when compared to last year. Discussed to work on a healthy diet and get regular exercise. Continue to monitor.

## 2017-12-17 NOTE — Progress Notes (Signed)
Subjective:    Patient ID: [name], female    DOB: 23-May-1970, 47 y.o.   MRN: [id number]  HPI  [name] is a 47 year old female who presents today for complete physical.  Immunizations: -Tetanus: Completed in 2016 -Influenza: Due today   Diet: She endorses a healthy diet Breakfast: Skips Lunch: Dry cereal, crackers Dinner: Meat, vegetables, starch Snacks: Crackers, cookies, candy  Desserts: Daily  Beverages: Mountain Dew, no water  Exercise: She is not exercising Eye exam: Follows with ophthalmology  Dental exam: Completes annually  Pap Smear: Completed in 2017 Mammogram: Completed in 2019  BP Readings from Last 3 Encounters:  12/17/17 122/76  12/11/16 116/70  11/30/16 138/78     Review of Systems  Constitutional: Negative for unexpected weight change.  HENT: Negative for rhinorrhea.   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   Cardiovascular: Negative for chest pain.  Gastrointestinal: Negative for constipation and diarrhea.  Genitourinary: Negative for difficulty urinating and menstrual problem.  Musculoskeletal: Negative for arthralgias and myalgias.  Skin: Negative for rash.  Allergic/Immunologic: Negative for environmental allergies.  Neurological: Negative for dizziness, numbness and headaches.  Psychiatric/Behavioral: The patient is not nervous/anxious.        No past medical history on file.   Social History   Socioeconomic History  . Marital status: Married    Spouse name: Not on file  . Number of children: 2  . Years of education: Not on file  . Highest education level: Not on file  Occupational History  . Occupation: rare book conservator  Social Needs  . Financial resource strain: Not on file  . Food insecurity:    Worry: Not on file    Inability: Not on file  . Transportation needs:    Medical: Not on file    Non-medical:  Not on file  Tobacco Use  . Smoking status: Never Smoker  . Smokeless tobacco: Never Used  Substance and Sexual Activity  . Alcohol use: No    Alcohol/week: 0.0 standard drinks  . Drug use: No  . Sexual activity: Not on file  Lifestyle  . Physical activity:    Days per week: Not on file    Minutes per session: Not on file  . Stress: Not on file  Relationships  . Social connections:    Talks on phone: Not on file    Gets together: Not on file    Attends religious service: Not on file    Active member of club or organization: Not on file    Attends meetings of clubs or organizations: Not on file    Relationship status: Not on file  . Intimate partner violence:    Fear of current or ex partner: Not on file    Emotionally abused: Not on file    Physically abused: Not on file    Forced sexual activity: Not on file  Other Topics Concern  . Not on file  Social History Narrative   Married.   2 children.   Works as a Building control surveyor.   Masters Degree   Enjoys spending time outside, once painted.    Past Surgical History:  Procedure Laterality Date  . CHOLECYSTECTOMY  2007  . WISDOM TOOTH EXTRACTION      Family History  Problem Relation Age of Onset  . Hypertension Father   . Heart disease Father  Myocardial infarction  . Hypercholesterolemia Father   . Colon cancer Father        in his 50s  . Anxiety disorder Sister   . Diabetes Maternal Grandmother        Deceased  . Lung cancer Maternal Grandfather   . Emphysema Paternal Grandmother     Allergies  Allergen Reactions  . Sulfa Antibiotics     Current Outpatient Medications on File Prior to Visit  Medication Sig Dispense Refill  . cholecalciferol (VITAMIN D) 1000 units tablet Take 1,000 Units by mouth daily. 2,000 u. Daily    . prednisoLONE acetate (PRED FORTE) 1 % ophthalmic suspension 1 drop 3 (three) times daily.    . valACYclovir (VALTREX) 500 MG tablet Take 500 mg by mouth daily.    .  valACYclovir (VALTREX) 500 MG tablet Take 500 mg by mouth daily.     No current facility-administered medications on file prior to visit.     BP 122/76   Pulse 83   Temp 98.3 F (36.8 C) (Oral)   Ht 5' (1.524 m)   Wt 159 lb (72.1 kg)   LMP 12/13/2017   SpO2 98%   BMI 31.05 kg/m    Objective:   Physical Exam  Constitutional: She is oriented to person, place, and time. She appears well-nourished.  HENT:  Mouth/Throat: No oropharyngeal exudate.  Eyes: Pupils are equal, round, and reactive to light. EOM are normal.  Neck: Neck supple. No thyromegaly present.  Cardiovascular: Normal rate and regular rhythm.  Respiratory: Effort normal and breath sounds normal.  GI: Soft. Bowel sounds are normal. There is no tenderness.  Musculoskeletal: Normal range of motion.  Neurological: She is alert and oriented to person, place, and time.  Skin: Skin is warm and dry.  Psychiatric: She has a normal mood and affect.           Assessment & Plan:

## 2017-12-17 NOTE — Assessment & Plan Note (Signed)
Following with ophthalmology, dose have residual symptoms of visual disturbance. Continue prednisolone and oral valtrex.

## 2018-06-02 ENCOUNTER — Encounter: Payer: Self-pay | Admitting: Gastroenterology

## 2018-06-08 ENCOUNTER — Telehealth: Payer: Self-pay | Admitting: Primary Care

## 2018-06-08 NOTE — Telephone Encounter (Signed)
Spoken and notified patient of Kate Clark's comments. Patient verbalized understanding.  

## 2018-06-08 NOTE — Telephone Encounter (Signed)
Please notify patient that the Shingles vaccination has not been approved by the CDC to administer to people under the age of 14, even if they've had shingles. I encourage her to call her opthalmologist as he can prescribe if he deems it to be necessary.

## 2018-06-08 NOTE — Telephone Encounter (Signed)
Best number [phone] ext [phone]  Pt called wanting to get shingles vaccine (new) Her eye dr recommended this  She stated she has has shingles in her eye for a year  Is it ok to schedule

## 2019-01-06 ENCOUNTER — Encounter: Payer: Self-pay | Admitting: Internal Medicine

## 2019-01-06 ENCOUNTER — Ambulatory Visit (INDEPENDENT_AMBULATORY_CARE_PROVIDER_SITE_OTHER): Payer: Self-pay | Admitting: Internal Medicine

## 2019-01-06 VITALS — Temp 98.7°F

## 2019-01-06 DIAGNOSIS — R197 Diarrhea, unspecified: Secondary | ICD-10-CM

## 2019-01-06 DIAGNOSIS — J029 Acute pharyngitis, unspecified: Secondary | ICD-10-CM

## 2019-01-06 LAB — POCT RAPID STREP A (OFFICE): Rapid Strep A Screen: NEGATIVE

## 2019-01-06 MED ORDER — AMOXICILLIN 500 MG PO CAPS: 500.0000 mg | ORAL_CAPSULE | Freq: Three times a day (TID) | ORAL | 0 refills | Status: DC

## 2019-01-06 MED ORDER — IBUPROFEN 600 MG PO TABS: 600.0000 mg | ORAL_TABLET | Freq: Three times a day (TID) | ORAL | 0 refills | Status: DC | PRN

## 2019-01-06 NOTE — Progress Notes (Signed)
Virtual Visit via Video Note  I connected with [name] on 01/06/19 at  4:30 PM EST by a video enabled telemedicine application and verified that I am speaking with the correct person using two identifiers.  Location: Patient: Home Provider: Office  Pt reports sore throat and diarrhea. This started 1-2 days ago. She is having some difficulty swallowing. She denies abdominal pain. The diarrhea has improved. She has not noticed any white spots on her throat. She denies headache, runny nose, nasal congestion, ear pain, loss of taste or smell, cough or SOB. She denies fever, chills or body aches. She has tried OTC sinus medications OTC with minimal relief. She has not had sick contacts that she is aware of or exposure to [place].    No past medical history on file.  Current Outpatient Medications  Medication Sig Dispense Refill  . cholecalciferol (VITAMIN D) 1000 units tablet Take 1,000 Units by mouth daily. 2,000 u. Daily    . prednisoLONE acetate (PRED FORTE) 1 % ophthalmic suspension 1 drop 3 (three) times daily.    . valACYclovir (VALTREX) 500 MG tablet Take 500 mg by mouth daily.     No current facility-administered medications for this visit.    Allergies  Allergen Reactions  . Sulfa Antibiotics     Family History  Problem Relation Age of Onset  . Hypertension Father   . Heart disease Father        Myocardial infarction  . Hypercholesterolemia Father   . Colon cancer Father        in his 40s  . Anxiety disorder Sister   . Diabetes Maternal Grandmother        Deceased  . Lung cancer Maternal Grandfather   . Emphysema Paternal Grandmother     Social History   Socioeconomic History  . Marital status: Married    Spouse name: Not on file  . Number of children: 2  . Years of education: Not on file  . Highest education level: Not on file  Occupational History  . Occupation: rare book conservator  Tobacco Use  . Smoking status: Never Smoker  . Smokeless tobacco:  Never Used  Substance and Sexual Activity  . Alcohol use: No    Alcohol/week: 0.0 standard drinks  . Drug use: No  . Sexual activity: Not on file  Other Topics Concern  . Not on file  Social History Narrative   Married.   2 children.   Works as a Building control surveyor.   Masters Degree   Enjoys spending time outside, once painted.   Social Determinants of Health   Financial Resource Strain:   . Difficulty of Paying Living Expenses: Not on file  Food Insecurity:   . Worried About Charity fundraiser in the Last Year: Not on file  . Ran Out of Food in the Last Year: Not on file  Transportation Needs:   . Lack of Transportation (Medical): Not on file  . Lack of Transportation (Non-Medical): Not on file  Physical Activity:   . Days of Exercise per Week: Not on file  . Minutes of Exercise per Session: Not on file  Stress:   . Feeling of Stress : Not on file  Social Connections:   . Frequency of Communication with Friends and Family: Not on file  . Frequency of Social Gatherings with Friends and Family: Not on file  . Attends Religious Services: Not on file  . Active Member of Clubs or Organizations: Not on file  .  Attends Archivist Meetings: Not on file  . Marital Status: Not on file  Intimate Partner Violence:   . Fear of Current or Ex-Partner: Not on file  . Emotionally Abused: Not on file  . Physically Abused: Not on file  . Sexually Abused: Not on file     Constitutional:  Denies fever, headache, malaise, fatigue, or abrupt weight changes.  HEENT: Pt reports sore throat. Denies eye pain, eye redness, ear pain, ringing in the ears, wax buildup, runny nose, bloody nose. Respiratory: Denies difficulty breathing, shortness of breath, cough or sputum production.   Cardiovascular: Denies chest pain, chest tightness, palpitations or swelling in the hands or feet.  Gastrointestinal: Pt reports diarrhea. Denies abdominal pain, bloating, constipation, or blood in the  stool.   No other specific complaints in a complete review of systems (except as listed in HPI above).    Observations/Objective: Temp 98.7 F (37.1 C) (Oral)   Wt Readings from Last 3 Encounters:  12/17/17 159 lb (72.1 kg)  12/11/16 156 lb 6.4 oz (70.9 kg)  11/30/16 154 lb 8 oz (70.1 kg)    General: Appears her stated age, well developed, well nourished in NAD. Skin: Warm, dry and intact. No rashes noted. HEENT: Throat/Mouth: Slightly erythematous.  Neck:  She reports she can feel swollen lymph nodes in the neck. Pulmonary/Chest: Normal effort. No respiratory distress.  Neurological: Alert and oriented.   BMET    Component Value Date/Time   NA 138 12/10/2017 0752   K 4.1 12/10/2017 0752   CL 105 12/10/2017 0752   CO2 26 12/10/2017 0752   GLUCOSE 97 12/10/2017 0752   BUN 12 12/10/2017 0752   CREATININE 0.86 12/10/2017 0752   CALCIUM 8.8 12/10/2017 0752    Lipid Panel     Component Value Date/Time   CHOL 196 12/10/2017 0752   TRIG 99.0 12/10/2017 0752   HDL 35.70 (L) 12/10/2017 0752   CHOLHDL 5 12/10/2017 0752   VLDL 19.8 12/10/2017 0752   LDLCALC 140 (H) 12/10/2017 0752    CBC    Component Value Date/Time   WBC 6.3 12/07/2016 0830   RBC 4.83 12/07/2016 0830   HGB 13.4 12/07/2016 0830   HCT 41.2 12/07/2016 0830   PLT 172.0 12/07/2016 0830   MCV 85.3 12/07/2016 0830   MCHC 32.5 12/07/2016 0830   RDW 14.5 12/07/2016 0830    Hgb A1C Lab Results  Component Value Date   HGBA1C 5.3 11/28/2015       Assessment and Plan:  Sore Throat, Diarrhea:  Had her come outside the lab for RST: negative RX for Ibuprofen 600 mg TID prn with food Encouraged salt water gargles.  No indication for abx at this time  Return precautions discussed  Follow Up Instructions:    I discussed the assessment and treatment plan with the patient. The patient was provided an opportunity to ask questions and all were answered. The patient agreed with the plan and  demonstrated an understanding of the instructions.   The patient was advised to call back or seek an in-person evaluation if the symptoms worsen or if the condition fails to improve as anticipated.     Webb Silversmith, NP

## 2019-01-07 ENCOUNTER — Encounter: Payer: Self-pay | Admitting: Internal Medicine

## 2019-01-07 NOTE — Patient Instructions (Signed)
Sore Throat When you have a sore throat, your throat may feel:  Tender.  Burning.  Irritated.  Scratchy.  Painful when you swallow.  Painful when you talk. Many things can cause a sore throat, such as:  An infection.  Allergies.  Dry air.  Smoke or pollution.  Radiation treatment.  Gastroesophageal reflux disease (GERD).  A tumor. A sore throat can be the first sign of another sickness. It can happen with other problems, like:  Coughing.  Sneezing.  Fever.  Swelling in the neck. Most sore throats go away without treatment. Follow these instructions at home:      Take over-the-counter medicines only as told by your doctor. ? If your child has a sore throat, do not give your child aspirin.  Drink enough fluids to keep your pee (urine) pale yellow.  Rest when you feel you need to.  To help with pain: ? Sip warm liquids, such as broth, herbal tea, or warm water. ? Eat or drink cold or frozen liquids, such as frozen ice pops. ? Gargle with a salt-water mixture 3-4 times a day or as needed. To make a salt-water mixture, add -1 tsp (3-6 g) of salt to 1 cup (237 mL) of warm water. Mix it until you cannot see the salt anymore. ? Suck on hard candy or throat lozenges. ? Put a cool-mist humidifier in your bedroom at night. ? Sit in the bathroom with the door closed for 5-10 minutes while you run hot water in the shower.  Do not use any products that contain nicotine or tobacco, such as cigarettes, e-cigarettes, and chewing tobacco. If you need help quitting, ask your doctor.  Wash your hands well and often with soap and water. If soap and water are not available, use hand sanitizer. Contact a doctor if:  You have a fever for more than 2-3 days.  You keep having symptoms for more than 2-3 days.  Your throat does not get better in 7 days.  You have a fever and your symptoms suddenly get worse.  Your child who is 3 months to 45 years old has a temperature of  102.41F (39C) or higher. Get help right away if:  You have trouble breathing.  You cannot swallow fluids, soft foods, or your saliva.  You have swelling in your throat or neck that gets worse.  You keep feeling sick to your stomach (nauseous).  You keep throwing up (vomiting). Summary  A sore throat is pain, burning, irritation, or scratchiness in the throat. Many things can cause a sore throat.  Take over-the-counter medicines only as told by your doctor. Do not give your child aspirin.  Drink plenty of fluids, and rest as needed.  Contact a doctor if your symptoms get worse or your sore throat does not get better within 7 days. This information is not intended to replace advice given to you by your health care provider. Make sure you discuss any questions you have with your health care provider. Document Released: 10/22/2007 Document Revised: 06/14/2017 Document Reviewed: 06/14/2017 Elsevier Patient Education  2020 [employer].

## 2019-02-01 ENCOUNTER — Other Ambulatory Visit: Payer: Self-pay | Admitting: Primary Care

## 2019-02-01 DIAGNOSIS — E559 Vitamin D deficiency, unspecified: Secondary | ICD-10-CM

## 2019-02-01 DIAGNOSIS — E785 Hyperlipidemia, unspecified: Secondary | ICD-10-CM

## 2019-02-07 ENCOUNTER — Other Ambulatory Visit (INDEPENDENT_AMBULATORY_CARE_PROVIDER_SITE_OTHER): Payer: Self-pay

## 2019-02-07 DIAGNOSIS — E559 Vitamin D deficiency, unspecified: Secondary | ICD-10-CM

## 2019-02-07 DIAGNOSIS — E785 Hyperlipidemia, unspecified: Secondary | ICD-10-CM

## 2019-02-07 LAB — COMPREHENSIVE METABOLIC PANEL
ALT: 19 U/L (ref 0–35)
AST: 16 U/L (ref 0–37)
Albumin: 4.1 g/dL (ref 3.5–5.2)
Alkaline Phosphatase: 74 U/L (ref 39–117)
BUN: 11 mg/dL (ref 6–23)
CO2: 26 mEq/L (ref 19–32)
Calcium: 8.7 mg/dL (ref 8.4–10.5)
Chloride: 105 mEq/L (ref 96–112)
Creatinine, Ser: 0.74 mg/dL (ref 0.40–1.20)
GFR: 84.19 mL/min (ref >60.00)
Glucose, Bld: 97 mg/dL (ref 70–99)
Potassium: 3.8 mEq/L (ref 3.5–5.1)
Sodium: 138 mEq/L (ref 135–145)
Total Bilirubin: 0.3 mg/dL (ref 0.2–1.2)
Total Protein: 6.7 g/dL (ref 6.0–8.3)

## 2019-02-07 LAB — CBC
HCT: 38.6 % (ref 36.0–46.0)
Hemoglobin: 12.7 g/dL (ref 12.0–15.0)
MCHC: 32.9 g/dL (ref 30.0–36.0)
MCV: 83.6 fl (ref 78.0–100.0)
Platelets: 198 10*3/uL (ref 150.0–400.0)
RBC: 4.62 Mil/uL (ref 3.87–5.11)
RDW: 15 % (ref 11.5–15.5)
WBC: 6.7 10*3/uL (ref 4.0–10.5)

## 2019-02-07 LAB — VITAMIN D 25 HYDROXY (VIT D DEFICIENCY, FRACTURES): VITD: 27.12 ng/mL — ABNORMAL LOW (ref 30.00–100.00)

## 2019-02-07 LAB — LIPID PANEL
Cholesterol: 193 mg/dL (ref 0–200)
HDL: 34.2 mg/dL — ABNORMAL LOW (ref >39.00)
LDL Cholesterol: 134 mg/dL — ABNORMAL HIGH (ref 0–99)
NonHDL: 158.7
Total CHOL/HDL Ratio: 6
Triglycerides: 123 mg/dL (ref 0.0–149.0)
VLDL: 24.6 mg/dL (ref 0.0–40.0)

## 2019-02-14 ENCOUNTER — Encounter: Payer: Self-pay | Admitting: Primary Care

## 2019-02-14 ENCOUNTER — Ambulatory Visit (INDEPENDENT_AMBULATORY_CARE_PROVIDER_SITE_OTHER): Payer: 59 | Admitting: Primary Care

## 2019-02-14 ENCOUNTER — Other Ambulatory Visit: Payer: Self-pay

## 2019-02-14 ENCOUNTER — Other Ambulatory Visit (HOSPITAL_COMMUNITY)
Admission: RE | Admit: 2019-02-14 | Discharge: 2019-02-14 | Disposition: A | Discharge status: Home / Self Care | Payer: 59 | Source: Ambulatory Visit | Attending: Internal Medicine | Admitting: Internal Medicine

## 2019-02-14 VITALS — BP 120/76 | HR 80 | Temp 97.0°F | Ht 60.0 in | Wt 165.5 lb

## 2019-02-14 DIAGNOSIS — Z1211 Encounter for screening for malignant neoplasm of colon: Secondary | ICD-10-CM | POA: Diagnosis not present

## 2019-02-14 DIAGNOSIS — Z1231 Encounter for screening mammogram for malignant neoplasm of breast: Secondary | ICD-10-CM

## 2019-02-14 DIAGNOSIS — Z124 Encounter for screening for malignant neoplasm of cervix: Secondary | ICD-10-CM | POA: Insufficient documentation

## 2019-02-14 DIAGNOSIS — E559 Vitamin D deficiency, unspecified: Secondary | ICD-10-CM

## 2019-02-14 DIAGNOSIS — Z0000 Encounter for general adult medical examination without abnormal findings: Secondary | ICD-10-CM | POA: Diagnosis not present

## 2019-02-14 DIAGNOSIS — Z23 Encounter for immunization: Secondary | ICD-10-CM

## 2019-02-14 DIAGNOSIS — Z8619 Personal history of other infectious and parasitic diseases: Secondary | ICD-10-CM

## 2019-02-14 DIAGNOSIS — E785 Hyperlipidemia, unspecified: Secondary | ICD-10-CM

## 2019-02-14 NOTE — Assessment & Plan Note (Signed)
LDL improved slightly from 2019. Encouraged a healthy diet with regular exercise. Continue to monitor.

## 2019-02-14 NOTE — Progress Notes (Signed)
Subjective:    Patient ID: [name], female    DOB: December 18, 1970, 49 y.o.   MRN: [id number]  HPI  This visit occurred during the SARS-CoV-2 public health emergency.  Safety protocols were in place, including screening questions prior to the visit, additional usage of staff PPE, and extensive cleaning of exam room while observing appropriate contact time as indicated for disinfecting solutions.   [name] is a 49 year old female who presents today for complete physical. She is also needing form completion for adoption.   Immunizations: -Tetanus: Completed in 2016 -Influenza: Due toady  Diet: She endorses a fair diet. Exercise: She is not exercising  Eye exam: Completes regularly  Dental exam: Completed in 2020  Pap Smear: Completed in 2017, due today Mammogram: Completed in 2019 Colonoscopy: Completed in 2017  BP Readings from Last 3 Encounters:  02/14/19 120/76  12/17/17 122/76  12/11/16 116/70     Review of Systems  Constitutional: Negative for unexpected weight change.  HENT: Negative for rhinorrhea.   Respiratory: Negative for cough and shortness of breath.   Cardiovascular: Negative for chest pain.  Gastrointestinal: Negative for constipation and diarrhea.  Genitourinary: Negative for difficulty urinating.       Increased PMS symptoms over the last several months  Musculoskeletal: Negative for arthralgias and myalgias.  Skin: Negative for rash.  Allergic/Immunologic: Negative for environmental allergies.  Neurological: Negative for numbness and headaches.  Psychiatric/Behavioral: The patient is not nervous/anxious.        No past medical history on file.   Social History   Socioeconomic History  . Marital status: Married    Spouse name: Not on file  . Number of children: 2  . Years of education: Not on file  . Highest education level: Not on file  Occupational History  . Occupation: rare book conservator  Tobacco Use  . Smoking status: Never  Smoker  . Smokeless tobacco: Never Used  Substance and Sexual Activity  . Alcohol use: No    Alcohol/week: 0.0 standard drinks  . Drug use: No  . Sexual activity: Not on file  Other Topics Concern  . Not on file  Social History Narrative   Married.   2 children.   Works as a Building control surveyor.   Masters Degree   Enjoys spending time outside, once painted.   Social Determinants of Health   Financial Resource Strain:   . Difficulty of Paying Living Expenses: Not on file  Food Insecurity:   . Worried About Charity fundraiser in the Last Year: Not on file  . Ran Out of Food in the Last Year: Not on file  Transportation Needs:   . Lack of Transportation (Medical): Not on file  . Lack of Transportation (Non-Medical): Not on file  Physical Activity:   . Days of Exercise per Week: Not on file  . Minutes of Exercise per Session: Not on file  Stress:   . Feeling of Stress : Not on file  Social Connections:   . Frequency of Communication with Friends and Family: Not on file  . Frequency of Social Gatherings with Friends and Family: Not on file  . Attends Religious Services: Not on file  . Active Member of Clubs or Organizations: Not on file  . Attends Archivist Meetings: Not on file  . Marital Status: Not on file  Intimate Partner Violence:   . Fear of Current or Ex-Partner: Not on file  . Emotionally Abused: Not on  file  . Physically Abused: Not on file  . Sexually Abused: Not on file    Past Surgical History:  Procedure Laterality Date  . CHOLECYSTECTOMY  2007  . WISDOM TOOTH EXTRACTION      Family History  Problem Relation Age of Onset  . Hypertension Father   . Heart disease Father        Myocardial infarction  . Hypercholesterolemia Father   . Colon cancer Father        in his 3s  . Anxiety disorder Sister   . Diabetes Maternal Grandmother        Deceased  . Lung cancer Maternal Grandfather   . Emphysema Paternal Grandmother     Allergies    Allergen Reactions  . Sulfa Antibiotics     Current Outpatient Medications on File Prior to Visit  Medication Sig Dispense Refill  . cholecalciferol (VITAMIN D) 1000 units tablet Take 1,000 Units by mouth daily. 2,000 u. Daily    . Cholecalciferol (VITAMIN D3) 125 MCG (5000 UT) CAPS Take by mouth.    . prednisoLONE acetate (PRED FORTE) 1 % ophthalmic suspension 1 drop 3 (three) times daily.     No current facility-administered medications on file prior to visit.    BP 120/76   Pulse 80   Temp (!) 97 F (36.1 C) (Temporal)   Ht 5' (1.524 m)   Wt 165 lb 8 oz (75.1 kg)   LMP 01/29/2019   SpO2 98%   BMI 32.32 kg/m    Objective:   Physical Exam  Constitutional: She is oriented to person, place, and time. She appears well-nourished.  HENT:  Right Ear: Tympanic membrane and ear canal normal.  Left Ear: Tympanic membrane and ear canal normal.  Mouth/Throat: Oropharynx is clear and moist.  Eyes: Pupils are equal, round, and reactive to light. EOM are normal.  Cardiovascular: Normal rate and regular rhythm.  Respiratory: Effort normal and breath sounds normal.  GI: Soft. Bowel sounds are normal. There is no abdominal tenderness.  Musculoskeletal:        General: Normal range of motion.     Cervical back: Neck supple.  Neurological: She is alert and oriented to person, place, and time. No cranial nerve deficit.  Reflex Scores:      Patellar reflexes are 2+ on the right side and 2+ on the left side. Skin: Skin is warm and dry.  Psychiatric: She has a normal mood and affect.           Assessment & Plan:

## 2019-02-14 NOTE — Patient Instructions (Signed)
Be sure to take your vitamin D 5000 units daily.  You will be contacted regarding your referral to GI for the colonoscopy.  Please let us know if you have not been contacted within two weeks.   Call the breast center to schedule your mammogram.  Start exercising. You should be getting 150 minutes of moderate intensity exercise weekly.  It's important to improve your diet by reducing consumption of fast food, fried food, processed snack foods, sugary drinks. Increase consumption of fresh vegetables and fruits, whole grains, water.  Ensure you are drinking 64 ounces of water daily.  It was a pleasure to see you today!   Preventive Care 94-47 Years Old, Female Preventive care refers to visits with your health care provider and lifestyle choices that can promote health and wellness. This includes:  A yearly physical exam. This may also be called an annual well check.  Regular dental visits and eye exams.  Immunizations.  Screening for certain conditions.  Healthy lifestyle choices, such as eating a healthy diet, getting regular exercise, not using drugs or products that contain nicotine and tobacco, and limiting alcohol use. What can I expect for my preventive care visit? Physical exam Your health care provider will check your:  Height and weight. This may be used to calculate body mass index (BMI), which tells if you are at a healthy weight.  Heart rate and blood pressure.  Skin for abnormal spots. Counseling Your health care provider may ask you questions about your:  Alcohol, tobacco, and drug use.  Emotional well-being.  Home and relationship well-being.  Sexual activity.  Eating habits.  Work and work Statistician.  Method of birth control.  Menstrual cycle.  Pregnancy history. What immunizations do I need?  Influenza (flu) vaccine  This is recommended every year. Tetanus, diphtheria, and pertussis (Tdap) vaccine  You may need a Td booster every 10  years. Varicella (chickenpox) vaccine  You may need this if you have not been vaccinated. Zoster (shingles) vaccine  You may need this after age 71. Measles, mumps, and rubella (MMR) vaccine  You may need at least one dose of MMR if you were born in 1957 or later. You may also need a second dose. Pneumococcal conjugate (PCV13) vaccine  You may need this if you have certain conditions and were not previously vaccinated. Pneumococcal polysaccharide (PPSV23) vaccine  You may need one or two doses if you smoke cigarettes or if you have certain conditions. Meningococcal conjugate (MenACWY) vaccine  You may need this if you have certain conditions. Hepatitis A vaccine  You may need this if you have certain conditions or if you travel or work in places where you may be exposed to hepatitis A. Hepatitis B vaccine  You may need this if you have certain conditions or if you travel or work in places where you may be exposed to hepatitis B. Haemophilus influenzae type b (Hib) vaccine  You may need this if you have certain conditions. Human papillomavirus (HPV) vaccine  If recommended by your health care provider, you may need three doses over 6 months. You may receive vaccines as individual doses or as more than one vaccine together in one shot (combination vaccines). Talk with your health care provider about the risks and benefits of combination vaccines. What tests do I need? Blood tests  Lipid and cholesterol levels. These may be checked every 5 years, or more frequently if you are over 33 years old.  Hepatitis C test.  Hepatitis B test.  Screening  Lung cancer screening. You may have this screening every year starting at age 59 if you have a 30-pack-year history of smoking and currently smoke or have quit within the past 15 years.  Colorectal cancer screening. All adults should have this screening starting at age 45 and continuing until age 27. Your health care provider may  recommend screening at age 48 if you are at increased risk. You will have tests every 1-10 years, depending on your results and the type of screening test.  Diabetes screening. This is done by checking your blood sugar (glucose) after you have not eaten for a while (fasting). You may have this done every 1-3 years.  Mammogram. This may be done every 1-2 years. Talk with your health care provider about when you should start having regular mammograms. This may depend on whether you have a family history of breast cancer.  BRCA-related cancer screening. This may be done if you have a family history of breast, ovarian, tubal, or peritoneal cancers.  Pelvic exam and Pap test. This may be done every 3 years starting at age 47. Starting at age 16, this may be done every 5 years if you have a Pap test in combination with an HPV test. Other tests  Sexually transmitted disease (STD) testing.  Bone density scan. This is done to screen for osteoporosis. You may have this scan if you are at high risk for osteoporosis. Follow these instructions at home: Eating and drinking  Eat a diet that includes fresh fruits and vegetables, whole grains, lean protein, and low-fat dairy.  Take vitamin and mineral supplements as recommended by your health care provider.  Do not drink alcohol if: ? Your health care provider tells you not to drink. ? You are pregnant, may be pregnant, or are planning to become pregnant.  If you drink alcohol: ? Limit how much you have to 0-1 drink a day. ? Be aware of how much alcohol is in your drink. In the [place] one drink equals one 12 oz bottle of beer (355 mL), one 5 oz glass of wine (148 mL), or one 1 oz glass of hard liquor (44 mL). Lifestyle  Take daily care of your teeth and gums.  Stay active. Exercise for at least 30 minutes on 5 or more days each week.  Do not use any products that contain nicotine or tobacco, such as cigarettes, e-cigarettes, and chewing tobacco. If  you need help quitting, ask your health care provider.  If you are sexually active, practice safe sex. Use a condom or other form of birth control (contraception) in order to prevent pregnancy and STIs (sexually transmitted infections).  If told by your health care provider, take low-dose aspirin daily starting at age 58. What's next?  Visit your health care provider once a year for a well check visit.  Ask your health care provider how often you should have your eyes and teeth checked.  Stay up to date on all vaccines. This information is not intended to replace advice given to you by your health care provider. Make sure you discuss any questions you have with your health care provider. Document Revised: 09/23/2017 Document Reviewed: 09/23/2017 Elsevier Patient Education  2020 [employer].

## 2019-02-14 NOTE — Assessment & Plan Note (Signed)
Following with ophthalmology, continue current regimen. 

## 2019-02-14 NOTE — Assessment & Plan Note (Signed)
Tetanus UTD, influenza vaccine UTD. Mammogram due, ordered. Colonoscopy due, referral placed to GI. Encouraged a healthy diet, regular exercise. Exam today benign. Labs reviewed.

## 2019-02-14 NOTE — Addendum Note (Signed)
Addended by: Jacqualin Combes on: 02/14/2019 04:01 PM   Modules accepted: Orders

## 2019-02-14 NOTE — Assessment & Plan Note (Signed)
Recent level of 27. She admits to inconsistent administration and will start taking daily. Continue to monitor.

## 2019-02-15 ENCOUNTER — Encounter: Payer: Self-pay | Admitting: *Deleted

## 2019-02-15 ENCOUNTER — Telehealth: Payer: Self-pay | Admitting: Primary Care

## 2019-02-15 NOTE — Telephone Encounter (Signed)
Spoken to patient and notified her that the paperwork is ready for pick up. Patient verbalized understating.

## 2019-02-15 NOTE — Telephone Encounter (Signed)
Patient stated she received a call from the office.   Did you try to reach the patient

## 2019-02-16 LAB — CYTOLOGY - PAP
Comment: NEGATIVE
Diagnosis: NEGATIVE
High risk HPV: NEGATIVE

## 2019-03-08 ENCOUNTER — Other Ambulatory Visit: Payer: Self-pay

## 2019-03-08 ENCOUNTER — Ambulatory Visit (AMBULATORY_SURGERY_CENTER): Payer: Self-pay | Admitting: *Deleted

## 2019-03-08 VITALS — Temp 96.9°F | Ht 60.0 in | Wt 159.0 lb

## 2019-03-08 DIAGNOSIS — Z800 Family history of malignant neoplasm of digestive organs: Secondary | ICD-10-CM

## 2019-03-08 DIAGNOSIS — Z86010 Personal history of colonic polyps: Secondary | ICD-10-CM

## 2019-03-08 DIAGNOSIS — Z01818 Encounter for other preprocedural examination: Secondary | ICD-10-CM

## 2019-03-08 NOTE — Progress Notes (Signed)

## 2019-03-08 NOTE — Progress Notes (Signed)
suprep sample provided  Lot  [id number] Exp 09/22

## 2019-03-20 ENCOUNTER — Encounter: Payer: 59 | Admitting: Gastroenterology

## 2019-05-01 ENCOUNTER — Encounter: Payer: Self-pay | Admitting: Primary Care

## 2019-11-08 ENCOUNTER — Other Ambulatory Visit (INDEPENDENT_AMBULATORY_CARE_PROVIDER_SITE_OTHER): Payer: Self-pay | Admitting: Family Medicine

## 2019-11-08 ENCOUNTER — Telehealth (INDEPENDENT_AMBULATORY_CARE_PROVIDER_SITE_OTHER): Payer: Self-pay | Admitting: Family Medicine

## 2019-11-08 ENCOUNTER — Other Ambulatory Visit: Payer: Self-pay

## 2019-11-08 ENCOUNTER — Encounter: Payer: Self-pay | Admitting: Family Medicine

## 2019-11-08 VITALS — Temp 97.2°F | Wt 160.0 lb

## 2019-11-08 DIAGNOSIS — Z20822 Contact with and (suspected) exposure to covid-19: Secondary | ICD-10-CM

## 2019-11-08 DIAGNOSIS — J029 Acute pharyngitis, unspecified: Secondary | ICD-10-CM

## 2019-11-08 LAB — POCT RAPID STREP A (OFFICE): Rapid Strep A Screen: NEGATIVE

## 2019-11-08 NOTE — Patient Instructions (Signed)
Drink plenty of fluids Get lots of rest  Sinus Congestion 1) Neti Pot (Saline rinse) -- 2 times day -- if tolerated 2) Flonase (Store Brand ok) - once daily 3) Over the counter congestion medications  Cough 1) Cough drops can be helpful 2) Nyquil (or nighttime cough medication) 3) Honey is proven to be one of the best cough medications  4) Cough medicine with Dextromethorphan can also be helpful  Sore Throat 1) Honey as above, cough drops 2) Ibuprofen or Aleve can be helpful 3) Salt water Gargles  If tested for Covid-19, isolate until the results are back.

## 2019-11-08 NOTE — Progress Notes (Signed)
Lab orders for afternoon appointment  Strept first If negative - covid testing

## 2019-11-08 NOTE — Progress Notes (Signed)
I connected with [name] on 11/08/19 at  9:20 AM EDT by video and verified that I am speaking with the correct person using two identifiers.   I discussed the limitations, risks, security and privacy concerns of performing an evaluation and management service by video and the availability of in person appointments. I also discussed with the patient that there may be a patient responsible charge related to this service. The patient expressed understanding and agreed to proceed.  Patient location: Home Provider Location: [place] Participants: Lesleigh Noe and Vilinda Blanks   Subjective:     [name] is a 49 y.o. female presenting for Sore Throat (x 4 days ) and Fatigue (x 1 day)     HPI  #Sore Throat  - 4 days ago - fatigue for 1 day - swollen lymph nodes - started as a regular cold - son was sick last week - tested negative covid - he was told he had a cold - the sore throat has persisted - no runny nose, no cough - treatment: dayquil/nyquil w improvement - tonsils are red with white lesion - no fever - 99.3-5  - not taking ibuprofen/tylenol - no loss of taste or smell  - stayed home from work yesterday   Did receive the J&J vaccine in the Spring   Review of Systems   Social History   Tobacco Use  Smoking Status Never Smoker  Smokeless Tobacco Never Used        Objective:   BP Readings from Last 3 Encounters:  02/14/19 120/76  12/17/17 122/76  12/11/16 116/70   Wt Readings from Last 3 Encounters:  11/08/19 160 lb (72.6 kg)  03/08/19 159 lb (72.1 kg)  02/14/19 165 lb 8 oz (75.1 kg)   Temp (!) 97.2 F (36.2 C) (Oral)   Wt 160 lb (72.6 kg)   BMI 31.25 kg/m    Physical Exam Constitutional:      Appearance: Normal appearance. She is not ill-appearing.  HENT:     Head: Normocephalic and atraumatic.     Right Ear: External ear normal.     Left Ear: External ear normal.     Mouth/Throat:     Mouth: Mucous membranes  are moist.     Pharynx: Oropharyngeal exudate (right) and posterior oropharyngeal erythema present.  Eyes:     Conjunctiva/sclera: Conjunctivae normal.  Pulmonary:     Effort: Pulmonary effort is normal. No respiratory distress.  Neurological:     Mental Status: She is alert. Mental status is at baseline.  Psychiatric:        Mood and Affect: Mood normal.        Behavior: Behavior normal.        Thought Content: Thought content normal.        Judgment: Judgment normal.             Assessment & Plan:   Problem List Items Addressed This Visit    None    Visit Diagnoses    Sore throat    -  Primary   Suspected COVID-19 virus infection         Discussed OTC treatment for viral illness Pt scheduled for afternoon Strept testing (as vaccinated to covid) and if negative will get follow-up covid testing.   Advised that she will need to quarrentine if covid testing performed  If strep positive - Will send in Penicillin V 500 mg BID for 10 days to pharmacy  Return if symptoms worsen or fail to improve.  Lesleigh Noe, MD

## 2021-02-26 ENCOUNTER — Ambulatory Visit (INDEPENDENT_AMBULATORY_CARE_PROVIDER_SITE_OTHER): Payer: 59 | Admitting: Primary Care

## 2021-02-26 ENCOUNTER — Other Ambulatory Visit: Payer: Self-pay

## 2021-02-26 ENCOUNTER — Encounter: Payer: Self-pay | Admitting: Primary Care

## 2021-02-26 VITALS — BP 124/80 | HR 89 | Temp 98.2°F | Ht 60.0 in | Wt 168.0 lb

## 2021-02-26 DIAGNOSIS — Z1159 Encounter for screening for other viral diseases: Secondary | ICD-10-CM

## 2021-02-26 DIAGNOSIS — Z0000 Encounter for general adult medical examination without abnormal findings: Secondary | ICD-10-CM | POA: Diagnosis not present

## 2021-02-26 DIAGNOSIS — Z114 Encounter for screening for human immunodeficiency virus [HIV]: Secondary | ICD-10-CM

## 2021-02-26 DIAGNOSIS — Z1211 Encounter for screening for malignant neoplasm of colon: Secondary | ICD-10-CM | POA: Diagnosis not present

## 2021-02-26 DIAGNOSIS — E785 Hyperlipidemia, unspecified: Secondary | ICD-10-CM | POA: Diagnosis not present

## 2021-02-26 DIAGNOSIS — E559 Vitamin D deficiency, unspecified: Secondary | ICD-10-CM | POA: Diagnosis not present

## 2021-02-26 DIAGNOSIS — Z1231 Encounter for screening mammogram for malignant neoplasm of breast: Secondary | ICD-10-CM

## 2021-02-26 DIAGNOSIS — Z8619 Personal history of other infectious and parasitic diseases: Secondary | ICD-10-CM

## 2021-02-26 DIAGNOSIS — Z23 Encounter for immunization: Secondary | ICD-10-CM

## 2021-02-26 NOTE — Patient Instructions (Addendum)
Stop by the lab prior to leaving today. I will notify you of your results once received.   You will be contacted regarding your referral to GI for the colonoscopy.  Please let us know if you have not been contacted within two weeks.   It was a pleasure to see you today!  Preventive Care 37-49 Years Old, Female Preventive care refers to lifestyle choices and visits with your health care provider that can promote health and wellness. Preventive care visits are also called wellness exams. What can I expect for my preventive care visit? Counseling Your health care provider may ask you questions about your: Medical history, including: Past medical problems. Family medical history. Pregnancy history. Current health, including: Menstrual cycle. Method of birth control. Emotional well-being. Home life and relationship well-being. Sexual activity and sexual health. Lifestyle, including: Alcohol, nicotine or tobacco, and drug use. Access to firearms. Diet, exercise, and sleep habits. Work and work Statistician. Sunscreen use. Safety issues such as seatbelt and bike helmet use. Physical exam Your health care provider will check your: Height and weight. These may be used to calculate your BMI (body mass index). BMI is a measurement that tells if you are at a healthy weight. Waist circumference. This measures the distance around your waistline. This measurement also tells if you are at a healthy weight and may help predict your risk of certain diseases, such as type 2 diabetes and high blood pressure. Heart rate and blood pressure. Body temperature. Skin for abnormal spots. What immunizations do I need? Vaccines are usually given at various ages, according to a schedule. Your health care provider will recommend vaccines for you based on your age, medical history, and lifestyle or other factors, such as travel or where you work. What tests do I need? Screening Your health care provider may  recommend screening tests for certain conditions. This may include: Lipid and cholesterol levels. Diabetes screening. This is done by checking your blood sugar (glucose) after you have not eaten for a while (fasting). Pelvic exam and Pap test. Hepatitis B test. Hepatitis C test. HIV (human immunodeficiency virus) test. STI (sexually transmitted infection) testing, if you are at risk. Lung cancer screening. Colorectal cancer screening. Mammogram. Talk with your health care provider about when you should start having regular mammograms. This may depend on whether you have a family history of breast cancer. BRCA-related cancer screening. This may be done if you have a family history of breast, ovarian, tubal, or peritoneal cancers. Bone density scan. This is done to screen for osteoporosis. Talk with your health care provider about your test results, treatment options, and if necessary, the need for more tests. Follow these instructions at home: Eating and drinking  Eat a diet that includes fresh fruits and vegetables, whole grains, lean protein, and low-fat dairy products. Take vitamin and mineral supplements as recommended by your health care provider. Do not drink alcohol if: Your health care provider tells you not to drink. You are pregnant, may be pregnant, or are planning to become pregnant. If you drink alcohol: Limit how much you have to 0-1 drink a day. Know how much alcohol is in your drink. In the [place] one drink equals one 12 oz bottle of beer (355 mL), one 5 oz glass of wine (148 mL), or one 1 oz glass of hard liquor (44 mL). Lifestyle Brush your teeth every morning and night with fluoride toothpaste. Floss one time each day. Exercise for at least 30 minutes 5 or more days  each week. Do not use any products that contain nicotine or tobacco. These products include cigarettes, chewing tobacco, and vaping devices, such as e-cigarettes. If you need help quitting, ask your health  care provider. Do not use drugs. If you are sexually active, practice safe sex. Use a condom or other form of protection to prevent STIs. If you do not wish to become pregnant, use a form of birth control. If you plan to become pregnant, see your health care provider for a prepregnancy visit. Take aspirin only as told by your health care provider. Make sure that you understand how much to take and what form to take. Work with your health care provider to find out whether it is safe and beneficial for you to take aspirin daily. Find healthy ways to manage stress, such as: Meditation, yoga, or listening to music. Journaling. Talking to a trusted person. Spending time with friends and family. Minimize exposure to UV radiation to reduce your risk of skin cancer. Safety Always wear your seat belt while driving or riding in a vehicle. Do not drive: If you have been drinking alcohol. Do not ride with someone who has been drinking. When you are tired or distracted. While texting. If you have been using any mind-altering substances or drugs. Wear a helmet and other protective equipment during sports activities. If you have firearms in your house, make sure you follow all gun safety procedures. Seek help if you have been physically or sexually abused. What's next? Visit your health care provider once a year for an annual wellness visit. Ask your health care provider how often you should have your eyes and teeth checked. Stay up to date on all vaccines. This information is not intended to replace advice given to you by your health care provider. Make sure you discuss any questions you have with your health care provider. Document Revised: 07/10/2020 Document Reviewed: 07/10/2020 Elsevier Patient Education  [employer].

## 2021-02-26 NOTE — Assessment & Plan Note (Signed)
Repeat vitamin D level pending. She is not taking vitamin D.

## 2021-02-26 NOTE — Assessment & Plan Note (Signed)
Influenza vaccine provided today. Other vaccines UTD Pap smear UTD Mammogram due, orders placed. Colonoscopy overdue, referral placed to GI.  Discussed the importance of a healthy diet and regular exercise in order for weight loss, and to reduce the risk of further co-morbidity.  Exam today stable. Labs pending.

## 2021-02-26 NOTE — Assessment & Plan Note (Signed)
Following with ophthalmology. Continue prednisolone 1% drops TID.

## 2021-02-26 NOTE — Progress Notes (Signed)
Subjective:    Patient ID: [name], female    DOB: 1970-03-27, 51 y.o.   MRN: [id number]  HPI  [name] is a very pleasant 51 y.o. female who presents today for complete physical and follow up of chronic conditions.  She has noticed feeling more irritable, night sweats, and differences in menses, premenstrual symptoms.   Still following with ophthalmology for prior herpes zoster. Managed on prednisolone drops, has recently undergone a dose reduction. Continues to notice blurred vision and paresthesias with pain to right side.   Immunizations: -Tetanus: 2016 -Influenza: Due today -Covid-19: 4 vaccines   Diet: Fair diet.  Exercise: No regular exercise.   Eye exam: Completes regularly  Dental exam: Completes semi-annually   Pap Smear: Completed in 2021 Mammogram: Completed in 2019, agrees today. Colonoscopy: Completed in 2017, due 2020 and has not completed. Agrees today.   BP Readings from Last 3 Encounters:  02/26/21 124/80  02/14/19 120/76  12/17/17 122/76    Wt Readings from Last 3 Encounters:  02/26/21 168 lb (76.2 kg)  11/08/19 160 lb (72.6 kg)  03/08/19 159 lb (72.1 kg)      Review of Systems  Constitutional:  Negative for unexpected weight change.  HENT:  Negative for rhinorrhea.   Eyes:  Positive for visual disturbance.  Respiratory:  Negative for cough and shortness of breath.   Cardiovascular:  Negative for chest pain.  Gastrointestinal:  Negative for constipation and diarrhea.  Genitourinary:  Negative for difficulty urinating.       Occurring monthly, heavier than usual   Musculoskeletal:  Negative for arthralgias and myalgias.  Skin:  Negative for rash.  Allergic/Immunologic: Negative for environmental allergies.  Neurological:  Positive for numbness. Negative for dizziness and headaches.  Psychiatric/Behavioral:  The patient is not nervous/anxious.         Past Medical History:  Diagnosis Date   Elevated blood pressure reading  09/27/2014   Shingles    right eye    Social History   Socioeconomic History   Marital status: Married    Spouse name: Not on file   Number of children: 2   Years of education: Not on file   Highest education level: Not on file  Occupational History   Occupation: rare book conservator  Tobacco Use   Smoking status: Never   Smokeless tobacco: Never  Substance and Sexual Activity   Alcohol use: No    Alcohol/week: 0.0 standard drinks   Drug use: No   Sexual activity: Not on file  Other Topics Concern   Not on file  Social History Narrative   Married.   2 children.   Works as a Building control surveyor.   Masters Degree   Enjoys spending time outside, once painted.   Social Determinants of Health   Financial Resource Strain: Not on file  Food Insecurity: Not on file  Transportation Needs: Not on file  Physical Activity: Not on file  Stress: Not on file  Social Connections: Not on file  Intimate Partner Violence: Not on file    Past Surgical History:  Procedure Laterality Date   CHOLECYSTECTOMY  2007   WISDOM TOOTH EXTRACTION      Family History  Problem Relation Age of Onset   Hypertension Father    Heart disease Father        Myocardial infarction   Hypercholesterolemia Father    Colon cancer Father        in his 62s   Anxiety disorder  Sister    Diabetes Maternal Grandmother        Deceased   Lung cancer Maternal Grandfather    Emphysema Paternal Grandmother    Stomach cancer Neg Hx    Rectal cancer Neg Hx     Allergies  Allergen Reactions   Sulfa Antibiotics     Current Outpatient Medications on File Prior to Visit  Medication Sig Dispense Refill   prednisoLONE acetate (PRED FORTE) 1 % ophthalmic suspension 1 drop 3 (three) times daily.     No current facility-administered medications on file prior to visit.    BP 124/80    Pulse 89    Temp 98.2 F (36.8 C) (Temporal)    Ht 5' (1.524 m)    Wt 168 lb (76.2 kg)    SpO2 98%    BMI 32.81 kg/m   Objective:   Physical Exam HENT:     Right Ear: Tympanic membrane and ear canal normal.     Left Ear: Tympanic membrane and ear canal normal.     Nose: Nose normal.  Eyes:     Conjunctiva/sclera: Conjunctivae normal.     Pupils: Pupils are equal, round, and reactive to light.  Neck:     Thyroid: No thyromegaly.  Cardiovascular:     Rate and Rhythm: Normal rate and regular rhythm.     Heart sounds: No murmur heard. Pulmonary:     Effort: Pulmonary effort is normal.     Breath sounds: Normal breath sounds. No rales.  Abdominal:     General: Bowel sounds are normal.     Palpations: Abdomen is soft.     Tenderness: There is no abdominal tenderness.  Musculoskeletal:        General: Normal range of motion.     Cervical back: Neck supple.  Lymphadenopathy:     Cervical: No cervical adenopathy.  Skin:    General: Skin is warm and dry.     Findings: No rash.  Neurological:     Mental Status: She is alert and oriented to person, place, and time.     Cranial Nerves: No cranial nerve deficit.     Deep Tendon Reflexes: Reflexes are normal and symmetric.  Psychiatric:        Mood and Affect: Mood normal.          Assessment & Plan:      This visit occurred during the SARS-CoV-2 public health emergency.  Safety protocols were in place, including screening questions prior to the visit, additional usage of staff PPE, and extensive cleaning of exam room while observing appropriate contact time as indicated for disinfecting solutions.

## 2021-02-26 NOTE — Assessment & Plan Note (Signed)
Discussed the importance of a healthy diet and regular exercise in order for weight loss, and to reduce the risk of further co-morbidity.  Repeat lipid panel pending. 

## 2021-02-27 LAB — LIPID PANEL
Cholesterol: 211 mg/dL — ABNORMAL HIGH (ref 0–200)
HDL: 37.8 mg/dL — ABNORMAL LOW (ref >39.00)
LDL Cholesterol: 145 mg/dL — ABNORMAL HIGH (ref 0–99)
NonHDL: 173.21
Total CHOL/HDL Ratio: 6
Triglycerides: 143 mg/dL (ref 0.0–149.0)
VLDL: 28.6 mg/dL (ref 0.0–40.0)

## 2021-02-27 LAB — HEPATITIS C ANTIBODY
Hepatitis C Ab: NONREACTIVE
SIGNAL TO CUT-OFF: 0.02 (ref <1.00)

## 2021-02-27 LAB — COMPREHENSIVE METABOLIC PANEL
ALT: 16 U/L (ref 0–35)
AST: 16 U/L (ref 0–37)
Albumin: 4.4 g/dL (ref 3.5–5.2)
Alkaline Phosphatase: 75 U/L (ref 39–117)
BUN: 8 mg/dL (ref 6–23)
CO2: 27 mEq/L (ref 19–32)
Calcium: 9.5 mg/dL (ref 8.4–10.5)
Chloride: 102 mEq/L (ref 96–112)
Creatinine, Ser: 0.74 mg/dL (ref 0.40–1.20)
GFR: 95.36 mL/min (ref >60.00)
Glucose, Bld: 67 mg/dL — ABNORMAL LOW (ref 70–99)
Potassium: 4.2 mEq/L (ref 3.5–5.1)
Sodium: 138 mEq/L (ref 135–145)
Total Bilirubin: 0.4 mg/dL (ref 0.2–1.2)
Total Protein: 7.2 g/dL (ref 6.0–8.3)

## 2021-02-27 LAB — CBC
HCT: 37.9 % (ref 36.0–46.0)
Hemoglobin: 12.3 g/dL (ref 12.0–15.0)
MCHC: 32.4 g/dL (ref 30.0–36.0)
MCV: 79.8 fl (ref 78.0–100.0)
Platelets: 221 10*3/uL (ref 150.0–400.0)
RBC: 4.76 Mil/uL (ref 3.87–5.11)
RDW: 15.7 % — ABNORMAL HIGH (ref 11.5–15.5)
WBC: 7.3 10*3/uL (ref 4.0–10.5)

## 2021-02-27 LAB — VITAMIN D 25 HYDROXY (VIT D DEFICIENCY, FRACTURES): VITD: 36.97 ng/mL (ref 30.00–100.00)

## 2021-02-27 LAB — HIV ANTIBODY (ROUTINE TESTING W REFLEX): HIV 1&2 Ab, 4th Generation: NONREACTIVE

## 2021-03-07 ENCOUNTER — Ambulatory Visit: Payer: 59

## 2021-04-16 ENCOUNTER — Ambulatory Visit (AMBULATORY_SURGERY_CENTER): Payer: 59 | Admitting: *Deleted

## 2021-04-16 ENCOUNTER — Other Ambulatory Visit: Payer: Self-pay

## 2021-04-16 VITALS — Ht 61.0 in | Wt 166.0 lb

## 2021-04-16 DIAGNOSIS — Z800 Family history of malignant neoplasm of digestive organs: Secondary | ICD-10-CM

## 2021-04-16 DIAGNOSIS — Z86010 Personal history of colonic polyps: Secondary | ICD-10-CM

## 2021-04-16 MED ORDER — ONDANSETRON HCL 4 MG PO TABS: 4.0000 mg | ORAL_TABLET | ORAL | 0 refills | Status: AC

## 2021-04-16 MED ORDER — PEG 3350-KCL-NA BICARB-NACL 420 G PO SOLR: 4000.0000 mL | Freq: Once | ORAL | 0 refills | Status: AC

## 2021-04-16 NOTE — Progress Notes (Signed)
No egg or soy allergy known to patient  ?No issues known to pt with past sedation with any surgeries or procedures ?Patient denies ever being told they had issues or difficulty with intubation  ?No FH of Malignant Hyperthermia ?Pt is not on diet pills ?Pt is not on  home 02  ?Pt is not on blood thinners  ?Pt denies issues with constipation  ?No A fib or A flutter ? ?Pt is fully vaccinated  for Covid  ? ?  NO PA's for preps discussed with pt In PV today  ?Discussed with pt there will be an out-of-pocket cost for prep and that varies from $0 to 70 +  dollars - pt verbalized understanding  ? ?Due to the COVID-19 pandemic we are asking patients to follow certain guidelines in PV and the [facility]   ?Pt aware of COVID protocols and [facility] guidelines  ? ?PV completed over the phone. Pt verified name, DOB, address and insurance during PV today.  ?Pt encouraged to call with questions or issues.  ?procedure instructions sent via My Chart as procedure is in 1 week ?Pt had issues completing SUprep last colon- will send in Zofran to help with nausea with GOlytely and pt instructed to use straw to drink prep as well -  ? ?

## 2021-04-17 ENCOUNTER — Encounter: Payer: Self-pay | Admitting: Gastroenterology

## 2021-04-23 ENCOUNTER — Other Ambulatory Visit: Payer: Self-pay

## 2021-04-23 ENCOUNTER — Encounter: Payer: Self-pay | Admitting: Gastroenterology

## 2021-04-23 ENCOUNTER — Ambulatory Visit (AMBULATORY_SURGERY_CENTER): Payer: 59 | Admitting: Gastroenterology

## 2021-04-23 VITALS — BP 129/84 | HR 81 | Temp 98.2°F | Resp 16 | Ht 61.0 in | Wt 166.0 lb

## 2021-04-23 DIAGNOSIS — D123 Benign neoplasm of transverse colon: Secondary | ICD-10-CM

## 2021-04-23 DIAGNOSIS — Z800 Family history of malignant neoplasm of digestive organs: Secondary | ICD-10-CM

## 2021-04-23 DIAGNOSIS — Z86010 Personal history of colonic polyps: Secondary | ICD-10-CM | POA: Diagnosis present

## 2021-04-23 DIAGNOSIS — D124 Benign neoplasm of descending colon: Secondary | ICD-10-CM

## 2021-04-23 DIAGNOSIS — D128 Benign neoplasm of rectum: Secondary | ICD-10-CM

## 2021-04-23 DIAGNOSIS — D125 Benign neoplasm of sigmoid colon: Secondary | ICD-10-CM | POA: Diagnosis not present

## 2021-04-23 DIAGNOSIS — D122 Benign neoplasm of ascending colon: Secondary | ICD-10-CM

## 2021-04-23 MED ORDER — SODIUM CHLORIDE 0.9 % IV SOLN: 500.0000 mL | Freq: Once | INTRAVENOUS | Status: DC

## 2021-04-23 NOTE — Progress Notes (Signed)
Pt non-responsive, VVS, Report to RN  °

## 2021-04-23 NOTE — Progress Notes (Signed)
Called to room to assist during endoscopic procedure.  Patient ID and intended procedure confirmed with present staff. Received instructions for my participation in the procedure from the performing physician.  

## 2021-04-23 NOTE — Progress Notes (Signed)
[facility] History and Physical ? ? ?Primary Care Physician:  Pleas Koch, NP ? ? ?Reason for Procedure:  History of adenomatous colon polyps ? ?Plan:    Surveillance colonoscopy with possible interventions as needed ? ? ? ? ?HPI: [name] is a very pleasant 51 y.o. female here for surveillance colonoscopy. ?Denies any nausea, vomiting, abdominal pain, melena or bright red blood per rectum ? ?The risks and benefits as well as alternatives of endoscopic procedure(s) have been discussed and reviewed. All questions answered. The patient agrees to proceed. ? ? ? ?Past Medical History:  ?Diagnosis Date  ? Elevated blood pressure reading 09/27/2014  ? GERD (gastroesophageal reflux disease)   ? OCC - PRN OTC Prilosec  ? Shingles   ? right eye  ? ? ?Past Surgical History:  ?Procedure Laterality Date  ? CHOLECYSTECTOMY  01/26/2005  ? COLONOSCOPY    ? ~ 6 yrs ago  ? WISDOM TOOTH EXTRACTION    ? ? ?Prior to Admission medications   ?Medication Sig Start Date End Date Taking? Authorizing Provider  ?omeprazole (PRILOSEC) 20 MG capsule Take 20 mg by mouth as needed.   Yes [provider]  ?ondansetron (ZOFRAN) 4 MG tablet Take 1 tablet (4 mg total) by mouth as directed. 04/16/21  Yes Mauri Pole, MD  ?prednisoLONE acetate (PRED FORTE) 1 % ophthalmic suspension 1 drop 3 (three) times daily.   Yes [provider]  ?cholecalciferol (VITAMIN D3) 25 MCG (1000 UNIT) tablet Take 1,000 Units by mouth daily.    [provider]  ? ? ?Current Outpatient Medications  ?Medication Sig Dispense Refill  ? omeprazole (PRILOSEC) 20 MG capsule Take 20 mg by mouth as needed.    ? ondansetron (ZOFRAN) 4 MG tablet Take 1 tablet (4 mg total) by mouth as directed. 2 tablet 0  ? prednisoLONE acetate (PRED FORTE) 1 % ophthalmic suspension 1 drop 3 (three) times daily.    ? cholecalciferol (VITAMIN D3) 25 MCG (1000 UNIT) tablet Take 1,000 Units by mouth daily.    ? ?Current Facility-Administered  Medications  ?Medication Dose Route Frequency Provider Last Rate Last Admin  ? 0.9 %  sodium chloride infusion  500 mL Intravenous Once Kizzi Overbey, Venia Minks, MD      ? ? ?Allergies as of 04/23/2021 - Review Complete 04/23/2021  ?Allergen Reaction Noted  ? Sulfa antibiotics Itching 02/08/2013  ? ? ?Family History  ?Problem Relation Age of Onset  ? Hypertension Father   ? Heart disease Father   ?     Myocardial infarction  ? Hypercholesterolemia Father   ? Colon cancer Father   ?     in his 90s  ? Anxiety disorder Sister   ? Diabetes Maternal Grandmother   ?     Deceased  ? Lung cancer Maternal Grandfather   ? Emphysema Paternal Grandmother   ? Stomach cancer Neg Hx   ? Rectal cancer Neg Hx   ? Colon polyps Neg Hx   ? Esophageal cancer Neg Hx   ? ? ?Social History  ? ?Socioeconomic History  ? Marital status: Married  ?  Spouse name: Not on file  ? Number of children: 2  ? Years of education: Not on file  ? Highest education level: Not on file  ?Occupational History  ? Occupation: rare book conservator  ?Tobacco Use  ? Smoking status: Never  ? Smokeless tobacco: Never  ?Vaping Use  ? Vaping Use: Never used  ?Substance and Sexual Activity  ?  Alcohol use: No  ?  Alcohol/week: 0.0 standard drinks  ? Drug use: No  ? Sexual activity: Not on file  ?Other Topics Concern  ? Not on file  ?Social History Narrative  ? Married.  ? 2 children.  ? Works as a Building control surveyor.  ? Masters Degree  ? Enjoys spending time outside, once painted.  ? ?Social Determinants of Health  ? ?Financial Resource Strain: Not on file  ?Food Insecurity: Not on file  ?Transportation Needs: Not on file  ?Physical Activity: Not on file  ?Stress: Not on file  ?Social Connections: Not on file  ?Intimate Partner Violence: Not on file  ? ? ?Review of Systems: ? ?All other review of systems negative except as mentioned in the HPI. ? ?Physical Exam: ?Vital signs in last 24 hours: ?BP 136/82   Pulse 90   Temp 98.2 ?F (36.8 ?C)   Ht '5\' 1"'$  (1.549 m)   Wt  166 lb (75.3 kg)   LMP 04/08/2021   SpO2 98%   BMI 31.37 kg/m?  ?General:   Alert, NAD ?Lungs:  Clear .   ?Heart:  Regular rate and rhythm ?Abdomen:  Soft, nontender and nondistended. ?Neuro/Psych:  Alert and cooperative. Normal mood and affect. A and O x 3 ? ?Reviewed labs, radiology imaging, old records and pertinent past GI work up ? ?Patient is appropriate for planned procedure(s) and anesthesia in an ambulatory setting ? ? ?K. Denzil Magnuson , MD ?[phone]  ? ? ?  ?

## 2021-04-23 NOTE — Op Note (Signed)
[place] ?Patient Name: [name] ?Procedure Date: 04/23/2021 9:47 AM ?MRN: [id number] ?Endoscopist: Mauri Pole , MD ?Age: 51 ?Referring MD:  ?Date of Birth: [birth year] ?Gender: Female ?Account #: [id number] ?Procedure:                Colonoscopy ?Indications:              Screening in patient at increased risk: Family  ?                          history of 1st-degree relative with colorectal  ?                          cancer, High risk colon cancer surveillance:  ?                          Personal history of colonic polyps, High risk colon  ?                          cancer surveillance: Personal history of multiple  ?                          (3 or more) adenomas ?Medicines:                Monitored Anesthesia Care ?Procedure:                Pre-Anesthesia Assessment: ?                          - Prior to the procedure, a History and Physical  ?                          was performed, and patient medications and  ?                          allergies were reviewed. The patient's tolerance of  ?                          previous anesthesia was also reviewed. The risks  ?                          and benefits of the procedure and the sedation  ?                          options and risks were discussed with the patient.  ?                          All questions were answered, and informed consent  ?                          was obtained. Prior Anticoagulants: The patient has  ?                          taken no previous anticoagulant or antiplatelet  ?  agents. ASA Grade Assessment: II - A patient with  ?                          mild systemic disease. After reviewing the risks  ?                          and benefits, the patient was deemed in  ?                          satisfactory condition to undergo the procedure. ?                          After obtaining informed consent, the colonoscope  ?                          was passed under direct vision. Throughout  the  ?                          procedure, the patient's blood pressure, pulse, and  ?                          oxygen saturations were monitored continuously. The  ?                          Olympus Scope [id number] was introduced through the  ?                          anus and advanced to the the cecum, identified by  ?                          appendiceal orifice and ileocecal valve. The  ?                          colonoscopy was performed without difficulty. The  ?                          patient tolerated the procedure well. The quality  ?                          of the bowel preparation was good. The ileocecal  ?                          valve, appendiceal orifice, and rectum were  ?                          photographed. ?Scope In: 9:57:05 AM ?Scope Out: 10:16:31 AM ?Scope Withdrawal Time: 0 hours 17 minutes 58 seconds  ?Total Procedure Duration: 0 hours 19 minutes 26 seconds  ?Findings:                 The perianal and digital rectal examinations were  ?                          normal. ?  Eight sessile polyps were found in the rectum X1,  ?                          sigmoid colon X1 , descending colon X2, transverse  ?                          colon X3 and ascending colon X1. The polyps were 4  ?                          to 11 mm in size. These polyps were removed with a  ?                          cold snare. Resection and retrieval were complete. ?                          Non-bleeding external and internal hemorrhoids were  ?                          found during retroflexion. The hemorrhoids were  ?                          medium-sized. ?Complications:            No immediate complications. ?Estimated Blood Loss:     Estimated blood loss was minimal. ?Impression:               - Eight 4 to 11 mm polyps in the rectum, in the  ?                          sigmoid colon, in the descending colon, in the  ?                          transverse colon and in the ascending colon,  ?                           removed with a cold snare. Resected and retrieved. ?                          - Non-bleeding external and internal hemorrhoids. ?Recommendation:           - Patient has a contact number available for  ?                          emergencies. The signs and symptoms of potential  ?                          delayed complications were discussed with the  ?                          patient. Return to normal activities tomorrow.  ?                          Written discharge instructions were provided to the  ?  patient. ?                          - Resume previous diet. ?                          - Continue present medications. ?                          - Await pathology results. ?                          - Repeat colonoscopy in 3 years for surveillance  ?                          based on pathology results. ?Mauri Pole, MD ?04/23/2021 10:27:04 AM ?This report has been signed electronically. ?

## 2021-04-23 NOTE — Patient Instructions (Signed)
Thank you for coming in to see [place] today. ?Resume previous diet and medications today. ?Return to normal daily activities tomorrow. ?Recommend surveillance colonoscopy will advise once today's biopsy results are complete. ? ? ?YOU HAD AN ENDOSCOPIC PROCEDURE TODAY AT [facility]:   Refer to the procedure report that was given to you for any specific questions about what was found during the examination.  If the procedure report does not answer your questions, please call your gastroenterologist to clarify.  If you requested that your care partner not be given the details of your procedure findings, then the procedure report has been included in a sealed envelope for you to review at your convenience later. ? ?YOU SHOULD EXPECT: Some feelings of bloating in the abdomen. Passage of more gas than usual.  Walking can help get rid of the air that was put into your GI tract during the procedure and reduce the bloating. If you had a lower endoscopy (such as a colonoscopy or flexible sigmoidoscopy) you may notice spotting of blood in your stool or on the toilet paper. If you underwent a bowel prep for your procedure, you may not have a normal bowel movement for a few days. ? ?Please Note:  You might notice some irritation and congestion in your nose or some drainage.  This is from the oxygen used during your procedure.  There is no need for concern and it should clear up in a day or so. ? ?SYMPTOMS TO REPORT IMMEDIATELY: ? ?Following lower endoscopy (colonoscopy or flexible sigmoidoscopy): ? Excessive amounts of blood in the stool ? Significant tenderness or worsening of abdominal pains ? Swelling of the abdomen that is new, acute ? Fever of 100?F or higher ? ? ? ?For urgent or emergent issues, a gastroenterologist can be reached at any hour by calling [phone]. ?Do not use MyChart messaging for urgent concerns.  ? ? ?DIET:  We do recommend a small meal at first, but then you may proceed to your  regular diet.  Drink plenty of fluids but you should avoid alcoholic beverages for 24 hours. ? ?ACTIVITY:  You should plan to take it easy for the rest of today and you should NOT DRIVE or use heavy machinery until tomorrow (because of the sedation medicines used during the test).   ? ?FOLLOW UP: ?Our staff will call the number listed on your records 48-72 hours following your procedure to check on you and address any questions or concerns that you may have regarding the information given to you following your procedure. If we do not reach you, we will leave a message.  We will attempt to reach you two times.  During this call, we will ask if you have developed any symptoms of COVID 19. If you develop any symptoms (ie: fever, flu-like symptoms, shortness of breath, cough etc.) before then, please call [phone].  If you test positive for Covid 19 in the 2 weeks post procedure, please call and report this information to [place].   ? ?If any biopsies were taken you will be contacted by phone or by letter within the next 1-3 weeks.  Please call us at [phone] if you have not heard about the biopsies in 3 weeks.  ? ? ?SIGNATURES/CONFIDENTIALITY: ?You and/or your care partner have signed paperwork which will be entered into your electronic medical record.  These signatures attest to the fact that that the information above on your After Visit Summary has been reviewed and is understood.  Full responsibility of the confidentiality of this discharge information lies with you and/or your care-partner.  ?

## 2021-04-23 NOTE — Progress Notes (Signed)
Pt's states no medical or surgical changes since previsit or office visit. Vs by DT. ?

## 2021-04-25 ENCOUNTER — Telehealth: Payer: Self-pay | Admitting: *Deleted

## 2021-04-25 NOTE — Telephone Encounter (Signed)
?  Follow up Call- ? ? ?  04/23/2021  ?  9:00 AM  ?Call back number  ?Post procedure Call Back phone  # [phone]  ?Permission to leave phone message Yes  ?  ? ?Patient questions: ? ?Do you have a fever, pain , or abdominal swelling? No. ?Pain Score  0 * ? ?Have you tolerated food without any problems? Yes.   ? ?Have you been able to return to your normal activities? Yes.   ? ?Do you have any questions about your discharge instructions: ?Diet   No. ?Medications  No. ?Follow up visit  No. ? ?Do you have questions or concerns about your Care? No. ? ?Actions: ?* If pain score is 4 or above: ?No action needed, pain <4. ? ? ?

## 2021-04-29 ENCOUNTER — Encounter: Payer: Self-pay | Admitting: Gastroenterology

## 2023-08-13 ENCOUNTER — Ambulatory Visit
Admission: RE | Admit: 2023-08-13 | Discharge: 2023-08-13 | Disposition: A | Discharge status: Home / Self Care | Payer: Self-pay | Source: Ambulatory Visit | Attending: Emergency Medicine | Admitting: Emergency Medicine

## 2023-08-13 VITALS — BP 133/96 | HR 110 | Temp 99.1°F | Resp 18 | Ht 60.0 in | Wt 170.0 lb

## 2023-08-13 DIAGNOSIS — N898 Other specified noninflammatory disorders of vagina: Secondary | ICD-10-CM | POA: Insufficient documentation

## 2023-08-13 MED ORDER — METRONIDAZOLE 500 MG PO TABS: 500.0000 mg | ORAL_TABLET | Freq: Two times a day (BID) | ORAL | 0 refills | Status: AC

## 2023-08-13 NOTE — Discharge Instructions (Addendum)
 Today you are being treated prophylactically for  Bacterial vaginosis   Take Metronidazole 500 mg twice a day for 7 days, do not drink alcohol while using medication, this will make you feel sick   Bacterial vaginosis which results from an overgrowth of one on several organisms that are normally present in your vagina. Vaginosis is an inflammation of the vagina that can result in discharge, itching and pain.  In addition: Avoid baths, hot tubs and whirlpool spas.  Don't use scented or harsh soaps Avoid irritants. These include scented tampons and pads. Wipe from front to back after using the toilet. Don't douche. Your vagina doesn't require cleansing other than normal bathing.  Use a condom.  Wear cotton underwear, this fabric absorbs some moisture.

## 2023-08-13 NOTE — ED Provider Notes (Signed)
 [name]    CSN: [id number] Arrival date & time: 08/13/23  1625      History   Chief Complaint Chief Complaint  Patient presents with   Vaginal Discharge    Tested vaginal pH it's:5.0-5.5. Menopausal so maybe it's BV. No discharge but have had vaginal odor for more than a month. Thought maybe it's just hormonal but it's gone on too long Some general discomfort and sometimes have burning sensation - Entered by patient    HPI [name] is a 53 y.o. female.   Patient presents for evaluation of foul vaginal odor present for 1 month.  Intermittently experiencing a burning sensation in the vaginal area.  Denies vaginal discharge, itching, urinary symptoms, abdominal pain, flank pain.  Has not attempted treatment.  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      Home Medications    Prior to Admission medications   Medication Sig Start Date End Date Taking? Authorizing Provider  metroNIDAZOLE (FLAGYL) 500 MG tablet Take 1 tablet (500 mg total) by mouth 2 (two) times daily. 08/13/23  Yes Eldrick Penick R, NP  cholecalciferol (VITAMIN D3) 25 MCG (1000 UNIT) tablet Take 1,000 Units by mouth daily.    [provider]  omeprazole (PRILOSEC) 20 MG capsule Take 20 mg by mouth as needed.    [provider]  ondansetron  (ZOFRAN ) 4 MG tablet Take 1 tablet (4 mg total) by mouth as directed. 04/16/21   Nandigam, Kavitha V, MD   prednisoLONE acetate (PRED FORTE) 1 % ophthalmic suspension 1 drop 3 (three) times daily.    [provider]    Family History Family History  Problem Relation Age of Onset   Hypertension Father    Heart disease Father        Myocardial infarction   Hypercholesterolemia Father    Colon cancer Father        in his 37s   Anxiety disorder Sister    Diabetes Maternal Grandmother        Deceased   Lung cancer Maternal Grandfather    Emphysema Paternal Grandmother    Stomach cancer Neg Hx    Rectal cancer Neg Hx    Colon polyps Neg Hx    Esophageal cancer Neg Hx     Social History Social History   Tobacco Use   Smoking status: Never   Smokeless tobacco: Never  Vaping Use   Vaping status: Never Used  Substance Use Topics   Alcohol use: No    Alcohol/week: 0.0 standard drinks of alcohol   Drug use: No     Allergies   Sulfa antibiotics   Review of Systems Review of Systems   Physical Exam Triage Vital Signs ED Triage Vitals  Encounter Vitals Group     BP 08/13/23 1649 (!) 133/96     Girls Systolic BP Percentile --  Girls Diastolic BP Percentile --      Boys Systolic BP Percentile --      Boys Diastolic BP Percentile --      Pulse Rate 08/13/23 1649 (!) 110     Resp 08/13/23 1649 18     Temp 08/13/23 1649 99.1 F (37.3 C)     Temp Source 08/13/23 1649 Oral     SpO2 08/13/23 1649 96 %     Weight 08/13/23 1644 170 lb (77.1 kg)     Height 08/13/23 1644 5' (1.524 m)     Head Circumference --      Peak Flow --      Pain Score 08/13/23 1644 0     Pain Loc --      Pain Education --      Exclude from Growth Chart --    No data found.  Updated Vital Signs BP (!) 133/96 (BP Location: Left Arm)   Pulse (!) 110   Temp 99.1 F (37.3 C) (Oral)   Resp 18   Ht 5' (1.524 m)   Wt 170 lb (77.1 kg)   SpO2 96%   BMI 33.20 kg/m   Visual Acuity Right Eye Distance:   Left Eye Distance:   Bilateral Distance:    Right Eye Near:   Left Eye Near:     Bilateral Near:     Physical Exam Constitutional:      Appearance: Normal appearance.  Eyes:     Extraocular Movements: Extraocular movements intact.  Pulmonary:     Effort: Pulmonary effort is normal.  Genitourinary:    Comments: deferred Neurological:     Mental Status: She is alert and oriented to person, place, and time. Mental status is at baseline.      UC Treatments / Results  Labs (all labs ordered are listed, but only abnormal results are displayed) Labs Reviewed  CERVICOVAGINAL ANCILLARY ONLY    EKG   Radiology No results found.  Procedures Procedures (including critical care time)  Medications Ordered in UC Medications - No data to display  Initial Impression / Assessment and Plan / UC Course  I have reviewed the triage vital signs and the nursing notes.  Pertinent labs & imaging results that were available during my care of the patient were reviewed by me and considered in my medical decision making (see chart for details).  Vaginal Odor  Treating empirically for BV, metronidazole prescribed, advised abstaining from alcohol during treatment, declined full STI testing only checking for yeast and BV, pending will treat additionally per protocol, recommended nonpharmacological supportive care, advised use of boric acid, may  follow-up as needed Final Clinical Impressions(s) / UC Diagnoses   Final diagnoses:  Vaginal odor     Discharge Instructions      Today you are being treated prophylactically for  Bacterial vaginosis   Take Metronidazole 500 mg twice a day for 7 days, do not drink alcohol while using medication, this will make you feel sick   Bacterial vaginosis which results from an overgrowth of one on several organisms that are normally present in your vagina. Vaginosis is an inflammation of the vagina that can result in discharge, itching and pain.  In addition: Avoid baths, hot tubs and whirlpool spas.  Don't use scented or harsh  soaps Avoid irritants. These include scented tampons and pads. Wipe from front to back after using the toilet. Don't douche. Your vagina doesn't require cleansing other than normal bathing.  Use a condom.  Wear  cotton underwear, this fabric absorbs some moisture.        ED Prescriptions     Medication Sig Dispense Auth. Provider   metroNIDAZOLE (FLAGYL) 500 MG tablet Take 1 tablet (500 mg total) by mouth 2 (two) times daily. 14 tablet Deloy Archey R, NP      PDMP not reviewed this encounter.   Teresa Shelba SAUNDERS, NP 08/13/23 [phone]

## 2023-08-13 NOTE — ED Triage Notes (Signed)
 Patient in office today complaint of vaginal odor and a little burning discomfort.per patient has a funky smell. Stated that this has been going on for a month or so.    OTC: none  Denies: N/V, fever,

## 2023-08-16 ENCOUNTER — Ambulatory Visit (HOSPITAL_COMMUNITY): Payer: Self-pay

## 2023-08-16 LAB — CERVICOVAGINAL ANCILLARY ONLY
Bacterial Vaginitis (gardnerella): POSITIVE — AB
Candida Glabrata: NEGATIVE
Candida Vaginitis: NEGATIVE
Comment: NEGATIVE
Comment: NEGATIVE
Comment: NEGATIVE
